# Patient Record
Sex: Male | Born: 1955 | Race: White | Hispanic: No | Marital: Single | Smoking: Current every day smoker
Health system: Southern US, Community
[De-identification: ages and names within clinical notes are randomized; demographics above are authoritative.]

## PROBLEM LIST (undated history)

## (undated) DIAGNOSIS — I10 Essential (primary) hypertension: Secondary | ICD-10-CM

## (undated) DIAGNOSIS — F329 Major depressive disorder, single episode, unspecified: Secondary | ICD-10-CM

## (undated) DIAGNOSIS — F32A Depression, unspecified: Secondary | ICD-10-CM

---

## 2017-10-03 ENCOUNTER — Encounter (HOSPITAL_COMMUNITY): Payer: Self-pay | Admitting: Emergency Medicine

## 2017-10-03 ENCOUNTER — Emergency Department (HOSPITAL_COMMUNITY): Payer: BLUE CROSS/BLUE SHIELD

## 2017-10-03 ENCOUNTER — Emergency Department (HOSPITAL_COMMUNITY)
Admission: EM | Admit: 2017-10-03 | Discharge: 2017-10-03 | Disposition: A | Discharge status: Other Acute Inpatient Hospital | Payer: BLUE CROSS/BLUE SHIELD | Attending: Emergency Medicine | Admitting: Emergency Medicine

## 2017-10-03 ENCOUNTER — Other Ambulatory Visit: Payer: Self-pay

## 2017-10-03 ENCOUNTER — Inpatient Hospital Stay (HOSPITAL_COMMUNITY)
Admission: AD | Admit: 2017-10-03 | Discharge: 2017-10-12 | DRG: 885 | Disposition: A | Discharge status: Home / Self Care | Payer: BLUE CROSS/BLUE SHIELD | Source: Intra-hospital | Attending: Psychiatry | Admitting: Psychiatry

## 2017-10-03 ENCOUNTER — Encounter (HOSPITAL_COMMUNITY): Payer: Self-pay | Admitting: *Deleted

## 2017-10-03 DIAGNOSIS — Z915 Personal history of self-harm: Secondary | ICD-10-CM | POA: Diagnosis not present

## 2017-10-03 DIAGNOSIS — Z560 Unemployment, unspecified: Secondary | ICD-10-CM | POA: Diagnosis not present

## 2017-10-03 DIAGNOSIS — J189 Pneumonia, unspecified organism: Secondary | ICD-10-CM | POA: Diagnosis present

## 2017-10-03 DIAGNOSIS — F17210 Nicotine dependence, cigarettes, uncomplicated: Secondary | ICD-10-CM | POA: Insufficient documentation

## 2017-10-03 DIAGNOSIS — Z598 Other problems related to housing and economic circumstances: Secondary | ICD-10-CM | POA: Diagnosis not present

## 2017-10-03 DIAGNOSIS — Z716 Tobacco abuse counseling: Secondary | ICD-10-CM

## 2017-10-03 DIAGNOSIS — Z008 Encounter for other general examination: Secondary | ICD-10-CM | POA: Diagnosis not present

## 2017-10-03 DIAGNOSIS — F1020 Alcohol dependence, uncomplicated: Secondary | ICD-10-CM | POA: Diagnosis not present

## 2017-10-03 DIAGNOSIS — T506X5A Adverse effect of antidotes and chelating agents, initial encounter: Secondary | ICD-10-CM | POA: Diagnosis not present

## 2017-10-03 DIAGNOSIS — F1024 Alcohol dependence with alcohol-induced mood disorder: Secondary | ICD-10-CM | POA: Diagnosis present

## 2017-10-03 DIAGNOSIS — R45851 Suicidal ideations: Secondary | ICD-10-CM | POA: Diagnosis present

## 2017-10-03 DIAGNOSIS — F332 Major depressive disorder, recurrent severe without psychotic features: Secondary | ICD-10-CM | POA: Insufficient documentation

## 2017-10-03 DIAGNOSIS — N2889 Other specified disorders of kidney and ureter: Secondary | ICD-10-CM | POA: Diagnosis not present

## 2017-10-03 DIAGNOSIS — X838XXA Intentional self-harm by other specified means, initial encounter: Secondary | ICD-10-CM | POA: Insufficient documentation

## 2017-10-03 DIAGNOSIS — M542 Cervicalgia: Secondary | ICD-10-CM | POA: Insufficient documentation

## 2017-10-03 DIAGNOSIS — I10 Essential (primary) hypertension: Secondary | ICD-10-CM | POA: Diagnosis present

## 2017-10-03 DIAGNOSIS — G4700 Insomnia, unspecified: Secondary | ICD-10-CM | POA: Diagnosis present

## 2017-10-03 DIAGNOSIS — F10239 Alcohol dependence with withdrawal, unspecified: Secondary | ICD-10-CM | POA: Diagnosis present

## 2017-10-03 DIAGNOSIS — T1491XA Suicide attempt, initial encounter: Secondary | ICD-10-CM | POA: Diagnosis not present

## 2017-10-03 DIAGNOSIS — Y92230 Patient room in hospital as the place of occurrence of the external cause: Secondary | ICD-10-CM | POA: Diagnosis not present

## 2017-10-03 DIAGNOSIS — J181 Lobar pneumonia, unspecified organism: Secondary | ICD-10-CM | POA: Insufficient documentation

## 2017-10-03 DIAGNOSIS — R110 Nausea: Secondary | ICD-10-CM | POA: Diagnosis not present

## 2017-10-03 DIAGNOSIS — F1010 Alcohol abuse, uncomplicated: Secondary | ICD-10-CM | POA: Diagnosis not present

## 2017-10-03 DIAGNOSIS — F329 Major depressive disorder, single episode, unspecified: Secondary | ICD-10-CM | POA: Diagnosis present

## 2017-10-03 DIAGNOSIS — F419 Anxiety disorder, unspecified: Secondary | ICD-10-CM | POA: Diagnosis present

## 2017-10-03 DIAGNOSIS — Z590 Homelessness: Secondary | ICD-10-CM | POA: Diagnosis not present

## 2017-10-03 DIAGNOSIS — K769 Liver disease, unspecified: Secondary | ICD-10-CM | POA: Diagnosis not present

## 2017-10-03 DIAGNOSIS — R450 Nervousness: Secondary | ICD-10-CM | POA: Diagnosis not present

## 2017-10-03 DIAGNOSIS — F1099 Alcohol use, unspecified with unspecified alcohol-induced disorder: Secondary | ICD-10-CM | POA: Diagnosis not present

## 2017-10-03 DIAGNOSIS — Z638 Other specified problems related to primary support group: Secondary | ICD-10-CM | POA: Diagnosis not present

## 2017-10-03 HISTORY — DX: Major depressive disorder, single episode, unspecified: F32.9

## 2017-10-03 HISTORY — DX: Essential (primary) hypertension: I10

## 2017-10-03 HISTORY — DX: Depression, unspecified: F32.A

## 2017-10-03 LAB — CBC WITH DIFFERENTIAL/PLATELET
ABS IMMATURE GRANULOCYTES: 0 10*3/uL (ref 0.0–0.1)
Basophils Absolute: 0.1 10*3/uL (ref 0.0–0.1)
Basophils Relative: 1 %
Eosinophils Absolute: 0.3 10*3/uL (ref 0.0–0.7)
Eosinophils Relative: 2 %
HEMATOCRIT: 51.9 % (ref 39.0–52.0)
Hemoglobin: 17.5 g/dL — ABNORMAL HIGH (ref 13.0–17.0)
IMMATURE GRANULOCYTES: 0 %
LYMPHS ABS: 2.8 10*3/uL (ref 0.7–4.0)
Lymphocytes Relative: 27 %
MCH: 29.5 pg (ref 26.0–34.0)
MCHC: 33.7 g/dL (ref 30.0–36.0)
MCV: 87.5 fL (ref 78.0–100.0)
MONO ABS: 0.8 10*3/uL (ref 0.1–1.0)
MONOS PCT: 8 %
NEUTROS ABS: 6.5 10*3/uL (ref 1.7–7.7)
NEUTROS PCT: 62 %
Platelets: 352 10*3/uL (ref 150–400)
RBC: 5.93 MIL/uL — ABNORMAL HIGH (ref 4.22–5.81)
RDW: 14.1 % (ref 11.5–15.5)
WBC: 10.5 10*3/uL (ref 4.0–10.5)

## 2017-10-03 LAB — COMPREHENSIVE METABOLIC PANEL
ALBUMIN: 4.4 g/dL (ref 3.5–5.0)
ALK PHOS: 43 U/L (ref 38–126)
ALT: 19 U/L (ref 17–63)
ANION GAP: 11 (ref 5–15)
AST: 20 U/L (ref 15–41)
BILIRUBIN TOTAL: 0.6 mg/dL (ref 0.3–1.2)
BUN: 13 mg/dL (ref 6–20)
CALCIUM: 9.1 mg/dL (ref 8.9–10.3)
CO2: 21 mmol/L — AB (ref 22–32)
Chloride: 103 mmol/L (ref 101–111)
Creatinine, Ser: 0.95 mg/dL (ref 0.61–1.24)
GFR calc Af Amer: >60 mL/min (ref >60)
GFR calc non Af Amer: >60 mL/min (ref >60)
GLUCOSE: 102 mg/dL — AB (ref 65–99)
Potassium: 4.4 mmol/L (ref 3.5–5.1)
SODIUM: 135 mmol/L (ref 135–145)
TOTAL PROTEIN: 7.5 g/dL (ref 6.5–8.1)

## 2017-10-03 LAB — ETHANOL: Alcohol, Ethyl (B): <10 mg/dL (ref <10)

## 2017-10-03 LAB — SALICYLATE LEVEL: Salicylate Lvl: <7 mg/dL (ref 2.8–30.0)

## 2017-10-03 LAB — ACETAMINOPHEN LEVEL: Acetaminophen (Tylenol), Serum: <10 ug/mL — ABNORMAL LOW (ref 10–30)

## 2017-10-03 MED ORDER — ONDANSETRON 4 MG PO TBDP: 4.0000 mg | ORAL_TABLET | Freq: Four times a day (QID) | ORAL | Status: AC | PRN

## 2017-10-03 MED ORDER — VITAMIN B-1 100 MG PO TABS
100.0000 mg | ORAL_TABLET | Freq: Every day | ORAL | Status: DC
  Administered 2017-10-04 – 2017-10-12 (×9): 100 mg via ORAL
  Filled 2017-10-03 (×11): qty 1

## 2017-10-03 MED ORDER — MAGNESIUM HYDROXIDE 400 MG/5ML PO SUSP: 30.0000 mL | Freq: Every day | ORAL | Status: DC | PRN

## 2017-10-03 MED ORDER — LORAZEPAM 1 MG PO TABS
0.0000 mg | ORAL_TABLET | Freq: Four times a day (QID) | ORAL | Status: DC
  Filled 2017-10-03: qty 1

## 2017-10-03 MED ORDER — LORAZEPAM 1 MG PO TABS: 1.0000 mg | ORAL_TABLET | Freq: Two times a day (BID) | ORAL | Status: DC

## 2017-10-03 MED ORDER — ONDANSETRON HCL 4 MG PO TABS: 4.0000 mg | ORAL_TABLET | Freq: Three times a day (TID) | ORAL | Status: DC | PRN

## 2017-10-03 MED ORDER — LORAZEPAM 1 MG PO TABS: 0.0000 mg | ORAL_TABLET | Freq: Two times a day (BID) | ORAL | Status: DC

## 2017-10-03 MED ORDER — HYDROCHLOROTHIAZIDE 25 MG PO TABS
25.0000 mg | ORAL_TABLET | Freq: Every day | ORAL | Status: DC
  Administered 2017-10-03: 25 mg via ORAL
  Filled 2017-10-03: qty 1

## 2017-10-03 MED ORDER — TRAZODONE HCL 50 MG PO TABS
50.0000 mg | ORAL_TABLET | Freq: Every evening | ORAL | Status: DC | PRN
  Administered 2017-10-04 – 2017-10-05 (×2): 50 mg via ORAL
  Filled 2017-10-03 (×6): qty 1

## 2017-10-03 MED ORDER — LORAZEPAM 2 MG/ML IJ SOLN: 0.0000 mg | Freq: Two times a day (BID) | INTRAMUSCULAR | Status: DC

## 2017-10-03 MED ORDER — LORAZEPAM 1 MG PO TABS
1.0000 mg | ORAL_TABLET | Freq: Four times a day (QID) | ORAL | Status: AC
  Administered 2017-10-03 – 2017-10-04 (×4): 1 mg via ORAL
  Filled 2017-10-03 (×3): qty 1

## 2017-10-03 MED ORDER — NICOTINE 21 MG/24HR TD PT24
21.0000 mg | MEDICATED_PATCH | Freq: Once | TRANSDERMAL | Status: DC
  Administered 2017-10-03: 21 mg via TRANSDERMAL
  Filled 2017-10-03: qty 1

## 2017-10-03 MED ORDER — IOHEXOL 300 MG/ML  SOLN
75.0000 mL | Freq: Once | INTRAMUSCULAR | Status: AC
  Administered 2017-10-03: 75 mL via INTRAVENOUS

## 2017-10-03 MED ORDER — HYDROCHLOROTHIAZIDE 25 MG PO TABS
25.0000 mg | ORAL_TABLET | Freq: Every day | ORAL | Status: DC
  Administered 2017-10-04 – 2017-10-12 (×9): 25 mg via ORAL
  Filled 2017-10-03 (×12): qty 1

## 2017-10-03 MED ORDER — LORAZEPAM 2 MG/ML IJ SOLN: 0.0000 mg | Freq: Four times a day (QID) | INTRAMUSCULAR | Status: DC

## 2017-10-03 MED ORDER — ACETAMINOPHEN 325 MG PO TABS
650.0000 mg | ORAL_TABLET | ORAL | Status: DC | PRN
  Administered 2017-10-03: 650 mg via ORAL
  Filled 2017-10-03: qty 2

## 2017-10-03 MED ORDER — LORAZEPAM 1 MG PO TABS
1.0000 mg | ORAL_TABLET | Freq: Three times a day (TID) | ORAL | Status: DC
  Filled 2017-10-03: qty 1

## 2017-10-03 MED ORDER — ZOLPIDEM TARTRATE 5 MG PO TABS
5.0000 mg | ORAL_TABLET | Freq: Every evening | ORAL | Status: DC | PRN
  Filled 2017-10-03: qty 1

## 2017-10-03 MED ORDER — LOPERAMIDE HCL 2 MG PO CAPS: 2.0000 mg | ORAL_CAPSULE | ORAL | Status: AC | PRN

## 2017-10-03 MED ORDER — THIAMINE HCL 100 MG/ML IJ SOLN: 100.0000 mg | Freq: Every day | INTRAMUSCULAR | Status: DC

## 2017-10-03 MED ORDER — VITAMIN B-1 100 MG PO TABS
100.0000 mg | ORAL_TABLET | Freq: Every day | ORAL | Status: DC
  Administered 2017-10-03: 100 mg via ORAL
  Filled 2017-10-03: qty 1

## 2017-10-03 MED ORDER — LORAZEPAM 1 MG PO TABS: 1.0000 mg | ORAL_TABLET | Freq: Four times a day (QID) | ORAL | Status: DC | PRN

## 2017-10-03 MED ORDER — AZITHROMYCIN 250 MG PO TABS
500.0000 mg | ORAL_TABLET | Freq: Once | ORAL | Status: AC
  Administered 2017-10-03: 500 mg via ORAL
  Filled 2017-10-03: qty 2

## 2017-10-03 MED ORDER — ALUM & MAG HYDROXIDE-SIMETH 200-200-20 MG/5ML PO SUSP: 30.0000 mL | Freq: Four times a day (QID) | ORAL | Status: DC | PRN

## 2017-10-03 MED ORDER — HYDROXYZINE HCL 25 MG PO TABS: 25.0000 mg | ORAL_TABLET | Freq: Four times a day (QID) | ORAL | Status: AC | PRN

## 2017-10-03 MED ORDER — ACETAMINOPHEN 325 MG PO TABS
650.0000 mg | ORAL_TABLET | Freq: Four times a day (QID) | ORAL | Status: DC | PRN
  Administered 2017-10-08 – 2017-10-10 (×2): 650 mg via ORAL
  Filled 2017-10-03 (×2): qty 2

## 2017-10-03 MED ORDER — AMOXICILLIN-POT CLAVULANATE ER 1000-62.5 MG PO TB12
2.0000 | ORAL_TABLET | Freq: Two times a day (BID) | ORAL | Status: AC
  Administered 2017-10-04 – 2017-10-10 (×14): 2 via ORAL
  Filled 2017-10-03 (×15): qty 2

## 2017-10-03 MED ORDER — AMOXICILLIN-POT CLAVULANATE ER 1000-62.5 MG PO TB12
2.0000 | ORAL_TABLET | Freq: Two times a day (BID) | ORAL | Status: DC
  Administered 2017-10-03: 2 via ORAL
  Filled 2017-10-03 (×2): qty 2

## 2017-10-03 MED ORDER — NICOTINE 21 MG/24HR TD PT24
21.0000 mg | MEDICATED_PATCH | Freq: Every day | TRANSDERMAL | Status: DC
  Administered 2017-10-04 – 2017-10-12 (×9): 21 mg via TRANSDERMAL
  Filled 2017-10-03 (×12): qty 1

## 2017-10-03 MED ORDER — LORAZEPAM 1 MG PO TABS: 1.0000 mg | ORAL_TABLET | Freq: Every day | ORAL | Status: DC

## 2017-10-03 MED ORDER — ALUM & MAG HYDROXIDE-SIMETH 200-200-20 MG/5ML PO SUSP: 30.0000 mL | ORAL | Status: DC | PRN

## 2017-10-03 MED ORDER — AZITHROMYCIN 250 MG PO TABS
250.0000 mg | ORAL_TABLET | Freq: Every day | ORAL | Status: AC
  Administered 2017-10-04 – 2017-10-07 (×4): 250 mg via ORAL
  Filled 2017-10-03 (×4): qty 1

## 2017-10-03 MED ORDER — AZITHROMYCIN 250 MG PO TABS: 250.0000 mg | ORAL_TABLET | Freq: Every day | ORAL | Status: DC

## 2017-10-03 NOTE — ED Triage Notes (Addendum)
Pt states he has been having suicidal thoughts for several weeks. Pt endorses suicidal attempt of hanging himself this morning. Pt endorses voises telling himself to kill himself. Denies homicidal ideation. Pt has been hospitalized for depression 4 years ago in [place]. Pt has hx of suicidal attempts in the past. Pt endorses frequent drinking, yesterday had 1 pint of vodka and several beers. Pt was sober for 2 years but started drinking again in September. No hx of DTs/ seizures.

## 2017-10-03 NOTE — Tx Team (Signed)
Initial Treatment Plan 10/03/2017 11:41 PM David Small [id number]    PATIENT STRESSORS: Financial difficulties Medication change or noncompliance Substance abuse   PATIENT STRENGTHS: Ability for insight Average or above average intelligence Capable of independent living Communication skills General fund of knowledge Motivation for treatment/growth   PATIENT IDENTIFIED PROBLEMS: Depression Alcohol abuse Suicidal thoughts and actions "I don't know, I had to do something, this is the second time this week I tried to kill myself"                     DISCHARGE CRITERIA:  Ability to meet basic life and health needs Improved stabilization in mood, thinking, and/or behavior Reduction of life-threatening or endangering symptoms to within safe limits Verbal commitment to aftercare and medication compliance  PRELIMINARY DISCHARGE PLAN: Attend aftercare/continuing care group Return to previous living arrangement  PATIENT/FAMILY INVOLVEMENT: This treatment plan has been presented to and reviewed with the patient, David Small, and/or family member, .  The patient and family have been given the opportunity to ask questions and make suggestions.  Journie Howson, [place], [place] 10/03/2017, 11:41 PM

## 2017-10-03 NOTE — Progress Notes (Signed)
David Small is a 62 year old male pt admitted on voluntary basis. On admission, David Small endorses depression and passive SI currently but is able to contract for safety while in the hospital. He spoke about how he tried to hang himself earlier in the day and reports it being the second time this week where he has tried something. He reports loneliness, financial issues and substance abuse as issues that he is dealing with. He reports that he was hospitalized about 5 years ago in [place] and reports he was prescribed medication but stopped after awhile and did not go for any follow-up. He reports that he has gone to a clinic in [place] recently that prescribed him medications for blood pressure but reports that he did not go get them filled. He reports that he does not currently have a primary care doctor. He reports that he has been abusing alcohol but denies any other substance abuse issue. He does not display any overt signs or symptoms of withdrawal however BP was 186/99 and was trending high in the emergency department. David Small reports that he rents a house with some friends and reports that he can go back there but is unsure if he wants to go back there. David Small was cooperative on admission and was oriented to the unit and safety was maintained.

## 2017-10-03 NOTE — ED Notes (Signed)
Reg. Diet ordered for pt.

## 2017-10-03 NOTE — ED Provider Notes (Signed)
[facility] EMERGENCY DEPARTMENT Provider Note   CSN: [id number] Arrival date & time: 10/03/17  1257     History   Chief Complaint Chief Complaint  Patient presents with  . Suicide Attempt  . Alcohol Problem    HPI David Small is a 62 y.o. male.  HPI  62 year old male with a history of depression and hypertension presents with suicidal thoughts and 2 suicide attempts. 2 days ago he tried to hang himself with a rope.  Tried again today.  He has some mild posterior neck pain where the knot was tied.  However he denies any trouble swallowing or speaking.  He has been feeling progressively depressed for about 3 weeks.  He also drinks a lot of alcohol daily, a minimum of about 7 beers per day.  He states that he has no significant family around and has been progressively more depressed.  Many years ago he used to be on medicine for depression but does not know what it was.  He has a chronic cough but denies any new or changing cough or fever/shortness of breath.  Past Medical History:  Diagnosis Date  . Depression   . Hypertension     Patient Active Problem List   Diagnosis Date Noted  . Severe recurrent major depression without psychotic features (HCC) 10/03/2017    History reviewed. No pertinent surgical history.      Home Medications    Prior to Admission medications   Not on File    Family History No family history on file.  Social History Social History   Tobacco Use  . Smoking status: Current Every Day Smoker    Types: Cigarettes  . Smokeless tobacco: Never Used  Substance Use Topics  . Alcohol use: Yes  . Drug use: Not Currently     Allergies   Patient has no known allergies.   Review of Systems Review of Systems  Constitutional: Negative for fever.  HENT: Negative for trouble swallowing and voice change.   Respiratory: Positive for cough. Negative for shortness of breath.   Cardiovascular: Negative for chest pain.    Musculoskeletal: Positive for neck pain.  Psychiatric/Behavioral: Positive for self-injury and suicidal ideas.  All other systems reviewed and are negative.    Physical Exam Updated Vital Signs BP (!) 168/95 (BP Location: Left Arm)   Pulse 81   Temp 98.4 F (36.9 C) (Oral)   Resp (!) 21   Ht 6' (1.829 m)   Wt 99.8 kg (220 lb)   SpO2 95%   BMI 29.84 kg/m   Physical Exam  Constitutional: He is oriented to person, place, and time. He appears well-developed and well-nourished. No distress.  HENT:  Head: Normocephalic and atraumatic.  Right Ear: External ear normal.  Left Ear: External ear normal.  Nose: Nose normal.  Eyes: Right eye exhibits no discharge. Left eye exhibits no discharge.  Neck: Phonation normal. Neck supple. No tracheal tenderness, no spinous process tenderness and no muscular tenderness present. No neck rigidity. No tracheal deviation, no erythema and normal range of motion present.  Cardiovascular: Normal rate, regular rhythm and normal heart sounds.  Pulmonary/Chest: Effort normal. No accessory muscle usage. No tachypnea. He has decreased breath sounds in the right lower field.  Abdominal: Soft. There is no tenderness.  Musculoskeletal: He exhibits no edema.  Neurological: He is alert and oriented to person, place, and time.  Skin: Skin is warm and dry. He is not diaphoretic.  Nursing note and vitals reviewed.  ED Treatments / Results  Labs (all labs ordered are listed, but only abnormal results are displayed) Labs Reviewed  COMPREHENSIVE METABOLIC PANEL - Abnormal; Notable for the following components:      Result Value   CO2 21 (*)    Glucose, Bld 102 (*)    All other components within normal limits  CBC WITH DIFFERENTIAL/PLATELET - Abnormal; Notable for the following components:   RBC 5.93 (*)    Hemoglobin 17.5 (*)    All other components within normal limits  ACETAMINOPHEN LEVEL - Abnormal; Notable for the following components:    Acetaminophen (Tylenol), Serum <10 (*)    All other components within normal limits  ETHANOL  SALICYLATE LEVEL  RAPID URINE DRUG SCREEN, HOSP PERFORMED    EKG None  Radiology Dg Chest 2 View  Result Date: 10/03/2017 CLINICAL DATA:  Medical clearance.  Suicide attempt.  Hypertension. EXAM: CHEST - 2 VIEW COMPARISON:  None. FINDINGS: There is patchy opacity in right mid lung anteriorly, concerning for a small focus of pneumonia. There is probable scarring in the lateral right base. Lungs elsewhere are clear. Heart size and pulmonary vascularity normal. No adenopathy. No bone lesions. IMPRESSION: Focal airspace opacity in the inferior aspect of the anterior segment right upper lobe, concerning for pneumonia. It is possible that this area represents scarring; there are no prior studies to compare. There is apparent scarring in the lateral right base with blunting the right costophrenic angle. Lungs elsewhere clear. Heart size normal. No evident adenopathy. Followup PA and lateral chest radiographs recommended in 3-4 weeks following trial of antibiotic therapy to ensure resolution and exclude underlying malignancy. Electronically Signed   By: Bretta BangWilliam  Woodruff III M.D.   On: 10/03/2017 13:58   Ct Soft Tissue Neck W Contrast  Result Date: 10/03/2017 CLINICAL DATA:  Attempted hanging.  Diffuse neck pain. EXAM: CT NECK WITH CONTRAST TECHNIQUE: Multidetector CT imaging of the neck was performed using the standard protocol following the bolus administration of intravenous contrast. CONTRAST:  75mL OMNIPAQUE IOHEXOL 300 MG/ML  SOLN COMPARISON:  None. FINDINGS: Pharynx and larynx: No mucosal or submucosal lesion. Oropharynx, hypopharynx and larynx superior intact. The glottis appears voluntarily closed. Salivary glands: Submandibular and parotid glands are normal. Thyroid: Normal Lymph nodes: No enlarged or low-density nodes on either side of the neck. Vascular: No abnormal vascular finding other than minimal  atherosclerosis at the carotid bifurcations. Limited intracranial: Normal Visualized orbits: Normal Mastoids and visualized paranasal sinuses: Normal Skeleton: No fracture. Ordinary osteoarthritis at the C1-2 articulation. Degenerative spondylosis most pronounced at C3-4 and C6-7. Upper chest: Negative Other: No evidence of disruption of the laryngeal cartilages. IMPRESSION: No traumatic finding by CT. No evidence of disruption of the airway. The glottis appears to be voluntarily closed. No evidence of cartilage injury or bone injury. No evidence of soft tissue hematoma. Electronically Signed   By: Paulina FusiMark  Shogry M.D.   On: 10/03/2017 17:18    Procedures Procedures (including critical care time)  Medications Ordered in ED Medications  iohexol (OMNIPAQUE) 300 MG/ML solution 75 mL (75 mLs Intravenous Contrast Given 10/03/17 1700)  azithromycin (ZITHROMAX) tablet 500 mg (500 mg Oral Given 10/03/17 1947)     Initial Impression / Assessment and Plan / ED Course  I have reviewed the triage vital signs and the nursing notes.  Pertinent labs & imaging results that were available during my care of the patient were reviewed by me and considered in my medical decision making (see chart for details).  Patient does not appear to have any significant trauma from his attempted suicide.  He is noted to be hypertensive but asymptomatic.  He will be started on anti-hypertensive medicines.  He does have some cough and with x-ray findings concerning for possible pneumonia, he will be covered for bacterial pneumonia with Augmentin and azithromycin.  However I do think he needs emergent psychiatric treatment and otherwise appears medically stable for this.  He will be admitted to the behavioral health  Final Clinical Impressions(s) / ED Diagnoses   Final diagnoses:  Community acquired pneumonia of right upper lobe of lung (HCC)  Alcohol abuse  Suicide attempt [facility])    ED Discharge Orders    None         Pricilla Loveless, MD 10/04/17 0005

## 2017-10-03 NOTE — ED Notes (Signed)
Patient transported to CT. Sitter with pt aware that pt is going to be transferred to [facility] upon return from ED.

## 2017-10-03 NOTE — ED Notes (Signed)
Belongings in locker 5 for primary RN to inventory

## 2017-10-03 NOTE — ED Notes (Signed)
TTS bedside 

## 2017-10-03 NOTE — ED Provider Notes (Signed)
Patient placed in Quick Look pathway, seen and evaluated   Chief Complaint: Suicide attempt  HPI:   62 year old male w/ a h/o of HTN and depression presenting with suicide attempt.  The patient attempted to hang himself.  He states that he placed the rope around his neck and actually pulled the chair out from under his feet.  He is now endorsing diffuse pain to the anterolateral neck.  He denies dyspnea, chest pain, or dysphagia.  He reports constant, worsening suicidal ideation over the last 3 weeks.  He has a history of previous suicide attempts by hanging.  He was previously taking antidepressants, but it is not taking any home medications at this time.  He was previously admitted for inpatient behavioral health treatment at a facility and [place] over the last few years.  He denies HI or auditory visual hallucinations.  He also reports worsening alcohol dependence that has been worsening since September 2018.  He is drinking approximately 1/5 of vodka and several beers every day.  He previously was able to go 2 years without drinking any alcohol.  Alcohol consumption has been progressively worsening.  No history of DTs or seizures or ICU admission from alcohol withdrawal.  Last drink was last night at 9 PM.  He is a current everyday smoker.  Denies IV or recreational drug use.   ROS: SI  Physical Exam:   Gen: No distress  Neuro: Awake and Alert  Skin: Warm    Focused Exam: No tenderness to palpation to the cervical spinous processes.  No tenderness to the bilateral paracervical spinal muscles.  He is diffusely tender to the musculature of the anterolateral neck.  Carotid pulses are 2+ and symmetric.  There is some erythema noted to the proximal skin of the anterior chest.  He is unable to state if this is new.  No ligature marks.   Initiation of care has begun. The patient has been counseled on the process, plan, and necessity for staying for the completion/evaluation, and the remainder of  the medical screening examination    Barkley BoardsMcDonald, Taylie Helder A, PA-C 10/03/17 1423    Gerhard MunchLockwood, [place], MD 10/05/17 2341

## 2017-10-03 NOTE — Progress Notes (Signed)
Report received from [place], [place]. Pt was oriented to unit. Bedtime medication given. Pt states he wants to go to sleep. No additional concerns. Will continue with POC.

## 2017-10-03 NOTE — BH Assessment (Signed)
[facility] Assessment Progress Note  Called pt's nurse to see if he can be put in a room for evaluation and she is in a trauma at this time. [facility] will call back when pt is in a room per Investment banker, operationalN writer spoke with.

## 2017-10-03 NOTE — BH Assessment (Signed)
Tele Assessment Note   Patient Name: David Small MRN: [id number] Referring Physician: Lawrence MarseillesGoldstone Location of Patient: [facility] Location of Provider: Behavioral Health TTS Department  David Small is an 62 y.o. male who presents voluntarily accompanied reporting primary symptoms of depression. He states that he attempted to hand himself  This morning. Pt endorses social withdrawal, loss of interest in usual pleasures, decreased concentration, fatigue, irritability, decreased sleep, decreased appetite and feelings of hopelessness.  Pt endorses SI, denies HI, psychosis. Pt  States that he relapsed drinking last September after being sober for 2 1/2 years. PT describes a couple of past attempts, no history of violence. Pt states that onset of symptoms began getting worse in the past month.  Pt identifies primary stressors as some things that have been going on in his life that he does not want to elaborate on, "It is too long of a story". He feels alone and has no support, feels hopeless. Pt identifies primary residence as in an apartment with some roommates, but has been feeling so low lately that he went to a hotel to be alone.   Pt denies legal involvement. Pt denies abuse history.  Pt identifiesprevious treatment as IP treatment 4 years ago followed up by Op which he quit after about 1 year when he felt like it wasn't helping. Pt cannot remember the medication he was on. Pt has fair insight and judgment. Pt's memory is typical.? ? MSE: Pt is casually dressed, alert, oriented x4 with normal speech and normal motor behavior. Eye contact is good. Pt's mood is depressed and affect is depressed and anxious. Affect is congruent with mood. Thought process is coherent and relevant. There is no indication that pt is currently responding to internal stimuli or experiencing delusional thought content. Pt was cooperative throughout assessment.   Nira ConnJason Berry, NP recommended intpatient psychiatric  treatment. Per Delorise Jacksonori, [facility], [facility] when his blood pressure comes down. Notified EDP/staff.  Diagnosis: MDD, recurrent, severe, without psychotic features  Past Medical History:  Past Medical History:  Diagnosis Date  . Depression   . Hypertension     History reviewed. No pertinent surgical history.  Family History: No family history on file.  Social History:  has no tobacco, alcohol, and drug history on file.  Additional Social History:  Alcohol / Drug Use Pain Medications: denies Prescriptions: denies Over the Counter: denies History of alcohol / drug use?: Yes Longest period of sobriety (when/how long): 10-20 years Substance #1 Name of Substance 1: alcohol 1 - Age of First Use: unknown 1 - Amount (size/oz): variable 1 - Frequency: daily 1 - Duration: since September 1 - Last Use / Amount: last night  CIWA: CIWA-Ar BP: (!) 170/94 Pulse Rate: 83 Nausea and Vomiting: no nausea and no vomiting Tactile Disturbances: none Tremor: three Auditory Disturbances: not present Paroxysmal Sweats: no sweat visible Visual Disturbances: not present Anxiety: no anxiety, at ease Headache, Fullness in Head: none present Agitation: normal activity Orientation and Clouding of Sensorium: oriented and can do serial additions CIWA-Ar Total: 3 COWS:    Allergies: No Known Allergies  Home Medications:  (Not in a hospital admission)  OB/GYN Status:  No LMP for male patient.  General Assessment Data [facility] Assessment: In system Is this a Tele or Face-to-Face Assessment?: Tele Assessment Is this an Initial Assessment or a Re-assessment for this encounter?: Initial Assessment Marital status: Single Is patient pregnant?: No Pregnancy Status: No Living Arrangements: (roomates) Can  pt return to current living arrangement?: Yes Admission Status: Voluntary Is patient capable of signing voluntary admission?: Yes Referral Source:  Self/Family/Friend Insurance type: BCBS     Crisis Care Plan Living Arrangements: (roomates) Name of Psychiatrist: none Name of Therapist: none  Education Status Is patient currently in school?: No Is the patient employed, unemployed or receiving disability?: Employed  Risk to self with the past 6 months Suicidal Ideation: Yes-Currently Present Has patient been a risk to self within the past 6 months prior to admission? : Yes Suicidal Intent: Yes-Currently Present Has patient had any suicidal intent within the past 6 months prior to admission? : Yes Is patient at risk for suicide?: Yes Suicidal Plan?: Yes-Currently Present Has patient had any suicidal plan within the past 6 months prior to admission? : Yes Specify Current Suicidal Plan: attempted hanging this am Access to Means: Yes Specify Access to Suicidal Means: rope What has been your use of drugs/alcohol within the last 12 months?: see SA section Previous Attempts/Gestures: Yes How many times?: (unkown) Triggers for Past Attempts: Unpredictable Intentional Self Injurious Behavior: None Family Suicide History: No Recent stressful life event(s): Turmoil (Comment)(pt declined to elaborate) Persecutory voices/beliefs?: No Depression: Yes Depression Symptoms: Despondent, Tearfulness, Isolating, Fatigue, Guilt, Loss of interest in usual pleasures, Feeling worthless/self pity, Feeling angry/irritable Substance abuse history and/or treatment for substance abuse?: Yes Suicide prevention information given to non-admitted patients: Not applicable  Risk to Others within the past 6 months Homicidal Ideation: No Does patient have any lifetime risk of violence toward others beyond the six months prior to admission? : No Thoughts of Harm to Others: No Current Homicidal Intent: No Current Homicidal Plan: No Access to Homicidal Means: No History of harm to others?: No Assessment of Violence: None Noted Does patient have access to  weapons?: No Criminal Charges Pending?: No Does patient have a court date: No Is patient on probation?: No  Psychosis Hallucinations: None noted Delusions: None noted  Mental Status Report Appearance/Hygiene: Unremarkable, In scrubs Eye Contact: Good Motor Activity: Unremarkable Speech: Logical/coherent Level of Consciousness: Alert Mood: Depressed, Anxious Affect: Anxious, Depressed Anxiety Level: Moderate Thought Processes: Coherent, Relevant Judgement: Partial Orientation: Person, Place, Time, Situation, Appropriate for developmental age Obsessive Compulsive Thoughts/Behaviors: Minimal  Cognitive Functioning Concentration: Normal Memory: Recent Intact, Remote Intact Is patient IDD: No Is patient DD?: No Insight: Fair Impulse Control: Fair Appetite: Poor Have you had any weight changes? : No Change Sleep: No Change Total Hours of Sleep: 8 Vegetative Symptoms: None  ADLScreening [facility] Assessment Services) Patient's cognitive ability adequate to safely complete daily activities?: Yes Patient able to express need for assistance with ADLs?: Yes Independently performs ADLs?: Yes (appropriate for developmental age)  Prior Inpatient Therapy Prior Inpatient Therapy: Yes Prior Therapy Dates: 4 years sgo Prior Therapy Facilty/Provider(s): in [place] Reason for Treatment: depression  Prior Outpatient Therapy Prior Outpatient Therapy: Yes Prior Therapy Dates: unk Prior Therapy Facilty/Provider(s): unk Reason for Treatment: depression Does patient have an ACCT team?: No Does patient have Intensive In-House Services?  : No Does patient have [facility] services? : No Does patient have P4CC services?: No  ADL Screening (condition at time of admission) Patient's cognitive ability adequate to safely complete daily activities?: Yes Is the patient deaf or have difficulty hearing?: No Does the patient have difficulty seeing, even when wearing glasses/contacts?: No Does the patient  have difficulty concentrating, remembering, or making decisions?: No Patient able to express need for assistance with ADLs?: Yes Does the patient have difficulty dressing or  bathing?: No Independently performs ADLs?: Yes (appropriate for developmental age) Does the patient have difficulty walking or climbing stairs?: No Weakness of Legs: None Weakness of Arms/Hands: None  Home Assistive Devices/Equipment Home Assistive Devices/Equipment: None  Therapy Consults (therapy consults require a physician order) PT Evaluation Needed: No OT Evalulation Needed: No SLP Evaluation Needed: No Abuse/Neglect Assessment (Assessment to be complete while patient is alone) Abuse/Neglect Assessment Can Be Completed: Yes Physical Abuse: Denies Verbal Abuse: Denies Sexual Abuse: Denies Exploitation of patient/patient's resources: Denies Self-Neglect: Denies Values / Beliefs Cultural Requests During Hospitalization: None Spiritual Requests During Hospitalization: None Consults Spiritual Care Consult Needed: No Social Work Consult Needed: No Merchant navy officer (For Healthcare) Does Patient Have a Medical Advance Directive?: No Would patient like information on creating a medical advance directive?: No - Patient declined    Additional Information 1:1 In Past 12 Months?: No CIRT Risk: No Elopement Risk: No Does patient have medical clearance?: Yes     Disposition:  Disposition Initial Assessment Completed for this Encounter: Yes Disposition of Patient: Admit Type of inpatient treatment program: Adult  This service was provided via telemedicine using a 2-way, interactive audio and Immunologist.  Names of all persons participating in this telemedicine service and their role in this encounter.               Yitzel Shasteen Hines 10/03/2017 9:09 PM

## 2017-10-03 NOTE — ED Notes (Addendum)
Per TTS[facility] can can decrease BP by 23:00. Per Criss AlvineGoldston, Md patient was given BP meds @ 19:45 and will take a little time to work. Will reassess BP @ 22:00.

## 2017-10-04 DIAGNOSIS — Z560 Unemployment, unspecified: Secondary | ICD-10-CM

## 2017-10-04 DIAGNOSIS — X838XXA Intentional self-harm by other specified means, initial encounter: Secondary | ICD-10-CM

## 2017-10-04 DIAGNOSIS — T1491XA Suicide attempt, initial encounter: Secondary | ICD-10-CM

## 2017-10-04 DIAGNOSIS — R450 Nervousness: Secondary | ICD-10-CM

## 2017-10-04 DIAGNOSIS — Z598 Other problems related to housing and economic circumstances: Secondary | ICD-10-CM

## 2017-10-04 DIAGNOSIS — I10 Essential (primary) hypertension: Secondary | ICD-10-CM

## 2017-10-04 DIAGNOSIS — F17210 Nicotine dependence, cigarettes, uncomplicated: Secondary | ICD-10-CM

## 2017-10-04 DIAGNOSIS — F1024 Alcohol dependence with alcohol-induced mood disorder: Secondary | ICD-10-CM | POA: Diagnosis present

## 2017-10-04 DIAGNOSIS — F332 Major depressive disorder, recurrent severe without psychotic features: Principal | ICD-10-CM

## 2017-10-04 DIAGNOSIS — J189 Pneumonia, unspecified organism: Secondary | ICD-10-CM

## 2017-10-04 DIAGNOSIS — Z915 Personal history of self-harm: Secondary | ICD-10-CM

## 2017-10-04 DIAGNOSIS — F419 Anxiety disorder, unspecified: Secondary | ICD-10-CM

## 2017-10-04 DIAGNOSIS — F1010 Alcohol abuse, uncomplicated: Secondary | ICD-10-CM

## 2017-10-04 LAB — HEMOGLOBIN A1C
Hgb A1c MFr Bld: 5.7 % — ABNORMAL HIGH (ref 4.8–5.6)
Mean Plasma Glucose: 116.89 mg/dL

## 2017-10-04 LAB — LIPID PANEL
Cholesterol: 221 mg/dL — ABNORMAL HIGH (ref 0–200)
HDL: 57 mg/dL (ref >40)
LDL CALC: 139 mg/dL — AB (ref 0–99)
TRIGLYCERIDES: 125 mg/dL (ref <150)
Total CHOL/HDL Ratio: 3.9 RATIO
VLDL: 25 mg/dL (ref 0–40)

## 2017-10-04 LAB — TSH: TSH: 4.175 u[IU]/mL (ref 0.350–4.500)

## 2017-10-04 MED ORDER — LISINOPRIL 5 MG PO TABS
5.0000 mg | ORAL_TABLET | Freq: Every day | ORAL | Status: DC
  Administered 2017-10-04 – 2017-10-05 (×2): 5 mg via ORAL
  Filled 2017-10-04 (×4): qty 1

## 2017-10-04 MED ORDER — SERTRALINE HCL 25 MG PO TABS
25.0000 mg | ORAL_TABLET | Freq: Every day | ORAL | Status: DC
  Administered 2017-10-04 – 2017-10-05 (×2): 25 mg via ORAL
  Filled 2017-10-04 (×4): qty 1

## 2017-10-04 MED ORDER — LORAZEPAM 1 MG PO TABS
1.0000 mg | ORAL_TABLET | Freq: Once | ORAL | Status: AC
  Administered 2017-10-04: 1 mg via ORAL
  Filled 2017-10-04: qty 1

## 2017-10-04 NOTE — [facility] Counselor (Signed)
Adult Comprehensive Assessment  Patient ID: David Small, male   DOB: Feb 03, 1956, 62 y.o.   MRN: [id number]  Information Source: Information source: Patient  Current Stressors:  Patient states their primary concerns and needs for treatment are:: need something to keep me more even keeled Patient states their goals for this hospitilization and ongoing recovery are:: find an even keel Employment / Job issues: Lost my job recently but I have found something part time.   Family Relationships: "My family has disowned me."  I don't see a reason to keep going.  Social relationships: Friends have left also.    Living/Environment/Situation:  Living Arrangements: Non-relatives/Friends(2 guys, boarding house situation) Living conditions (as described by patient or guardian): Pt gets along with house mates. safe Who else lives in the home?: none What is atmosphere in current home: Comfortable  Family History:  Marital status: Divorced Divorced, when?: 10 years ago What types of issues is patient dealing with in the relationship?: No current relationship Are you sexually active?: No What is your sexual orientation?: heterosexual Has your sexual activity been affected by drugs, alcohol, medication, or emotional stress?: na Does patient have children?: Yes How many children?: 1 How is patient's relationship with their children?: son age 63.  They don't get along, issues going back to pt's divorce when son was 10.  No contact in 8 years.  Childhood History:  By whom was/is the patient raised?: Both parents Additional childhood history information: Parents remained married.  good childhood Description of patient's relationship with caregiver when they were a child: mom: great, dad: he worked a lot, but OK.  They were close later in life prior to dad's death. Patient's description of current relationship with people who raised him/her: both parents deceased. How were you disciplined when you got in  trouble as a child/adolescent?: appropriate discipline Does patient have siblings?: Yes Number of Siblings: 2 Description of patient's current relationship with siblings: one brother, one sister.  No contact with either.  Long term conflict with brother.  Problems with sister during second divorce. Did patient suffer any verbal/emotional/physical/sexual abuse as a child?: No Did patient suffer from severe childhood neglect?: No Has patient ever been sexually abused/assaulted/raped as an adolescent or adult?: No Was the patient ever a victim of a crime or a disaster?: No Witnessed domestic violence?: No Has patient been effected by domestic violence as an adult?: No  Education:  Highest grade of school patient has completed: associates degree in Chief Financial Officermarketing Currently a Consulting civil engineerstudent?: No Learning disability?: No  Employment/Work Situation:   Employment situation: Retired(part time employment: Pharmacist, [employer]) Patient's job has been impacted by current illness: No What is the longest time patient has a held a job?: 18 years Where was the patient employed at that time?: [employer] Did You Receive Any Psychiatric Treatment/Services While in the [employer]?: No(No [employer]ilitary service) Are There Guns or Other Weapons in Your Home?: No  Financial Resources:   Financial resources: Income from employment, Private insurance(social security/retirement) Does patient have a Lawyerrepresentative payee or guardian?: No  Alcohol/Substance Abuse:   What has been your use of drugs/alcohol within the last 12 months?: alcohol: daily use, 4-5 beers daily, (up to 10) past 10 months.  drugs: pt denies If attempted suicide, did drugs/alcohol play a role in this?: Yes Alcohol/Substance Abuse Treatment Hx: Past Tx, Inpatient If yes, describe treatment: [place] [facility] Has alcohol/substance abuse ever caused legal problems?: No  Social Support System:   Conservation officer, natureatient's Community Support System:  None Type of faith/religion: Ephriam Knuckles How does patient's faith help to cope with current illness?: I don't know  Leisure/Recreation:   Leisure and Hobbies: play golf, take walks, bike  Strengths/Needs:   What is the patient's perception of their strengths?: I'm a problem solver, intelligence Patient states they can use these personal strengths during their treatment to contribute to their recovery: possible to start over with new relationships and heal old family relationships Patient states these barriers may affect/interfere with their treatment: none Patient states these barriers may affect their return to the community: hopelessness, transportation, financial Other important information patient would like considered in planning for their treatment: none  Discharge Plan:   Currently receiving community mental health services: No Patient states concerns and preferences for aftercare planning are: Pt not sure where he is going to live. Patient states they will know when they are safe and ready for discharge when: when I feel some hope Does patient have access to transportation?: No Does patient have financial barriers related to discharge medications?: No Patient description of barriers related to discharge medications: CSW assessing for plan Plan for no access to transportation at discharge: CSW assessing for plan Plan for living situation after discharge: CSW assessing for plan. Will patient be returning to same living situation after discharge?: No  Summary/Recommendations:   Summary and Recommendations (to be completed by the evaluator): Pt is 62 year old male, currently in [place], but may be losing his housing.  Pt is diagnosed with major depressive disorder and was admitted due to increased depression and two recent suicide attempts.  Pt is cut off from family and friends and experiencing hopelessness.  Recommendations for pt include crisis stabilization, therapeutic milieu,  attend and participate in groups, medication management, and development of comprehensive mental wellness plan.  Lorri Frederick. 10/04/2017

## 2017-10-04 NOTE — Progress Notes (Signed)
Pt was observed in the dayroom, attending wrap-up group. Pt appears depressed in affect and mood. Pt denies SI/HI/AVH/Pain at this time. Pt states he feels "sedated" today. Pt states he does not want to take Ativan anymore. BP remains elevated; Pt asymptomatic. Will continue with POC.

## 2017-10-04 NOTE — [facility] Group Notes (Signed)
[facility] LCSW Group Therapy Note  Date/Time: 10/04/17, 1315  Type of Therapy/Topic:  Group Therapy:  Balance in Life  Participation Level:  active  Description of Group:    This group will address the concept of balance and how it feels and looks when one is unbalanced. Patients will be encouraged to process areas in their lives that are out of balance, and identify reasons for remaining unbalanced. Facilitators will guide patients utilizing problem- solving interventions to address and correct the stressor making their life unbalanced. Understanding and applying boundaries will be explored and addressed for obtaining  and maintaining a balanced life. Patients will be encouraged to explore ways to assertively make their unbalanced needs known to significant others in their lives, using other group members and facilitator for support and feedback.  Therapeutic Goals: 1. Patient will identify two or more emotions or situations they have that consume much of in their lives. 2. Patient will identify signs/triggers that life has become out of balance:  3. Patient will identify two ways to set boundaries in order to achieve balance in their lives:  4. Patient will demonstrate ability to communicate their needs through discussion and/or role plays  Summary of Patient Progress:Pt shared that friends, family, and financial as areas of his life that are out of balance.  Pt participated in group discussion regarding ways to recognize and return these areas of life to better balance. Pt shared quite a bit about his situation and interacted with other group members as well.          Therapeutic Modalities:   Cognitive Behavioral Therapy Solution-Focused Therapy Assertiveness Training  Daleen SquibbGreg Yanet [place], [place]

## 2017-10-04 NOTE — [facility] Suicide Risk Assessment (Signed)
[facility] INPATIENT:  Family/Significant Other Suicide Prevention Education  Suicide Prevention Education:  Patient Refusal for Family/Significant Other Suicide Prevention Education: The patient David Small has refused to provide written consent for family/significant other to be provided Family/Significant Other Suicide Prevention Education during admission and/or prior to discharge.  Physician notified.  Lorri FrederickWierda, [place] Terrones Jon, LCSW 10/04/2017, 2:59 PM

## 2017-10-04 NOTE — H&P (Signed)
Psychiatric Admission Assessment Adult  Patient Identification: David Small MRN:  [id number] Date of Evaluation:  10/04/2017 Chief Complaint:  MDD alocohol use disorder moderate Principal Diagnosis: Severe recurrent major depression without psychotic features (HCC) Diagnosis:   Patient Active Problem List   Diagnosis Date Noted  . Severe recurrent major depression without psychotic features (HCC) [F33.2] 10/03/2017   History of Present Illness:  10/03/17 [facility] Counselor Assessment: 62 y.o. male who presents voluntarily accompanied reporting primary symptoms of depression. He states that he attempted to hand himself  This morning. Pt endorses social withdrawal, loss of interest in usual pleasures, decreased concentration, fatigue, irritability, decreased sleep, decreased appetite and feelings of hopelessness. Pt endorses SI, denies HI, psychosis. Pt  States that he relapsed drinking last September after being sober for 2 1/2 years. PT describes a couple of past attempts, no history of violence. Pt states that onset of symptoms began getting worse in the past month. Pt identifies primary stressors as some things that have been going on in his life that he does not want to elaborate on, "It is too long of a story". He feels alone and has no support, feels hopeless. Pt identifies primary residence as in an apartment with some roommates, but has been feeling so low lately that he went to a hotel to be alone.  Pt denies legal involvement. Pt denies abuse history. Pt identifiesprevious treatment as IP treatment 4 years ago followed up by Op which he quit after about 1 year when he felt like it wasn't helping. Pt cannot remember the medication he was on. Pt has fair insight and judgment. Pt's memory is typical.?  10/04/17 [facility] MD Assessment: Patient is seen and examined.  Patient is a 63 year old male with a past psychiatric history significant for major depression as well as alcohol use disorder.  He  presented to [facility] emergency department yesterday with suicidal ideation.  The patient presented with symptoms of depression including helplessness, hopelessness, worthlessness, fatigue, suicidal ideation.  The patient admitted that he had attempted to hang himself 1 to 2 days prior to admission.  He had a CT scan of the neck which was essentially negative.  He also complained of a cough, and was found to have a pneumonia by chest x-ray.  He stated that he his depression had worsened over the last 7 months.  He had been sober for approximately 2-1/2 years, but then started drinking approximately 7 months ago.  He stated since then his depression worsened.  He stated that he had no friends, no family contacts, and had recently lost his part-time job.  This put his housing in some significant problem.  He admitted to a previous suicide attempt in 2015.  He was hospitalized at that time.  He is unsure what medication he took at that time, but believes it may have been Zoloft.  He stated that after his hospitalization he was unable to afford that medication.  He was admitted to the hospital for evaluation and stabilization.  On evaluation today: Patient confirms the other information and feels that he has told everything he needs to say at this time. He agree to attend groups and to participate in activities . He states taht he will be safe on the unit, with no current SI/HI/AVH.   Associated Signs/Symptoms: Depression Symptoms:  depressed mood, anhedonia, fatigue, feelings of worthlessness/guilt, hopelessness, suicidal thoughts with specific plan, suicidal attempt, loss of energy/fatigue, disturbed sleep, (Hypo) Manic Symptoms:  Impulsivity, Anxiety Symptoms:  Excessive Worry,  Psychotic Symptoms:  Denies PTSD Symptoms: NA Total Time spent with patient: 30 minutes  Past Psychiatric History: 1 previous hospitalization for suicide attempt  Is the patient at risk to self? Yes.    Has the  patient been a risk to self in the past 6 months? Yes.    Has the patient been a risk to self within the distant past? Yes.    Is the patient a risk to others? No.  Has the patient been a risk to others in the past 6 months? No.  Has the patient been a risk to others within the distant past? No.   Prior Inpatient Therapy:   Prior Outpatient Therapy:    Alcohol Screening: 1. How often do you have a drink containing alcohol?: 4 or more times a week 2. How many drinks containing alcohol do you have on a typical day when you are drinking?: 5 or 6 3. How often do you have six or more drinks on one occasion?: Weekly AUDIT-C Score: 9 4. How often during the last year have you found that you were not able to stop drinking once you had started?: Never 5. How often during the last year have you failed to do what was normally expected from you becasue of drinking?: Less than monthly 6. How often during the last year have you needed a first drink in the morning to get yourself going after a heavy drinking session?: Never 7. How often during the last year have you had a feeling of guilt of remorse after drinking?: Weekly 8. How often during the last year have you been unable to remember what happened the night before because you had been drinking?: Never 9. Have you or someone else been injured as a result of your drinking?: No 10. Has a relative or friend or a doctor or another health worker been concerned about your drinking or suggested you cut down?: No Alcohol Use Disorder Identification Test Final Score (AUDIT): 13 Intervention/Follow-up: Alcohol Education Substance Abuse History in the last 12 months:  Yes.   Consequences of Substance Abuse: Medical Consequences:  reviewed Legal Consequences:  reviewed Family Consequences:  reviewed Previous Psychotropic Medications: Yes  Psychological Evaluations: Yes  Past Medical History:  Past Medical History:  Diagnosis Date  . Depression   .  Hypertension    History reviewed. No pertinent surgical history. Family History: History reviewed. No pertinent family history. Family Psychiatric  History: Denies Tobacco Screening: Have you used any form of tobacco in the last 30 days? (Cigarettes, Smokeless Tobacco, Cigars, and/or Pipes): Yes Tobacco use, Select all that apply: 5 or more cigarettes per day Are you interested in Tobacco Cessation Medications?: Yes, will notify MD for an order Counseled patient on smoking cessation including recognizing danger situations, developing coping skills and basic information about quitting provided: Refused/Declined practical counseling Social History:  Social History   Substance and Sexual Activity  Alcohol Use Yes     Social History   Substance and Sexual Activity  Drug Use Not Currently    Additional Social History:                           Allergies:  No Known Allergies Lab Results:  Results for orders placed or performed during the hospital encounter of 10/03/17 (from the past 48 hour(s))  Hemoglobin A1c     Status: Abnormal   Collection Time: 10/04/17  6:25 AM  Result Value Ref Range  Hgb A1c MFr Bld 5.7 (H) 4.8 - 5.6 %    Comment: (NOTE) Pre diabetes:          5.7%-6.4% Diabetes:              >6.4% Glycemic control for   <7.0% adults with diabetes    Mean Plasma Glucose 116.89 mg/dL    Comment: Performed at [facility], [address] [place], [place] [address]  Lipid panel     Status: Abnormal   Collection Time: 10/04/17  6:25 AM  Result Value Ref Range   Cholesterol 221 (H) 0 - 200 mg/dL   Triglycerides 604125 <540<150 mg/dL   HDL 57 >98>40 mg/dL   Total CHOL/HDL Ratio 3.9 RATIO   VLDL 25 0 - 40 mg/dL   LDL Cholesterol 119139 (H) 0 - 99 mg/dL    Comment:        Total Cholesterol/HDL:CHD Risk Coronary Heart Disease Risk Table                     Men   Women  1/2 Average Risk   3.4   3.3  Average Risk       5.0   4.4  2 X Average Risk   9.6   7.1  3 X  Average Risk  23.4   11.0        Use the calculated Patient Ratio above and the CHD Risk Table to determine the patient's CHD Risk.        ATP III CLASSIFICATION (LDL):  <100     mg/dL   Optimal  147-829100-129  mg/dL   Near or Above                    Optimal  130-159  mg/dL   Borderline  562-130160-189  mg/dL   High  >865>190     mg/dL   Very High Performed at [facility], [address] [place], [place] [address]   TSH     Status: None   Collection Time: 10/04/17  6:25 AM  Result Value Ref Range   TSH 4.175 0.350 - 4.500 uIU/mL    Comment: Performed by a 3rd Generation assay with a functional sensitivity of <=0.01 uIU/mL. Performed at [facility], [address] [place], [place] [address]     Blood Alcohol level:  Lab Results  Component Value Date   ETH <10 10/03/2017    Metabolic Disorder Labs:  Lab Results  Component Value Date   HGBA1C 5.7 (H) 10/04/2017   MPG 116.89 10/04/2017   No results found for: PROLACTIN Lab Results  Component Value Date   CHOL 221 (H) 10/04/2017   TRIG 125 10/04/2017   HDL 57 10/04/2017   CHOLHDL 3.9 10/04/2017   VLDL 25 10/04/2017   LDLCALC 139 (H) 10/04/2017    Current Medications: Current Facility-Administered Medications  Medication Dose Route Frequency Provider Last Rate Last Dose  . acetaminophen (TYLENOL) tablet 650 mg  650 mg Oral Q6H PRN Jackelyn PolingBerry, Jason A, NP      . alum & mag hydroxide-simeth (MAALOX/MYLANTA) 200-200-20 MG/5ML suspension 30 mL  30 mL Oral Q4H PRN Nira ConnBerry, Jason A, NP      . amoxicillin-clavulanate (AUGMENTIN XR) 1000-62.5 MG per 12 hr tablet 2 tablet  2 tablet Oral Q12H Nira ConnBerry, Jason A, NP   2 tablet at 10/04/17 0817  . azithromycin (ZITHROMAX) tablet 250 mg  250 mg Oral Daily Nira ConnBerry, Jason  A, NP      . hydrochlorothiazide (HYDRODIURIL) tablet 25 mg  25 mg Oral Daily Nira Conn A, NP   25 mg at 10/04/17 1610  . hydrOXYzine (ATARAX/VISTARIL) tablet 25 mg  25 mg Oral Q6H PRN Nira Conn  A, NP      . lisinopril (PRINIVIL,ZESTRIL) tablet 5 mg  5 mg Oral Daily Antonieta Pert, MD   5 mg at 10/04/17 9604  . loperamide (IMODIUM) capsule 2-4 mg  2-4 mg Oral PRN Nira Conn A, NP      . LORazepam (ATIVAN) tablet 1 mg  1 mg Oral Q6H PRN Nira Conn A, NP      . LORazepam (ATIVAN) tablet 1 mg  1 mg Oral QID Nira Conn A, NP   1 mg at 10/04/17 1154   Followed by  . [START ON 10/05/2017] LORazepam (ATIVAN) tablet 1 mg  1 mg Oral TID Jackelyn Poling, NP       Followed by  . [START ON 10/06/2017] LORazepam (ATIVAN) tablet 1 mg  1 mg Oral BID Jackelyn Poling, NP       Followed by  . [START ON 10/07/2017] LORazepam (ATIVAN) tablet 1 mg  1 mg Oral Daily Nira Conn A, NP      . magnesium hydroxide (MILK OF MAGNESIA) suspension 30 mL  30 mL Oral Daily PRN Nira Conn A, NP      . nicotine (NICODERM CQ - dosed in mg/24 hours) patch 21 mg  21 mg Transdermal Daily Antonieta Pert, MD   21 mg at 10/04/17 0746  . ondansetron (ZOFRAN-ODT) disintegrating tablet 4 mg  4 mg Oral Q6H PRN Nira Conn A, NP      . sertraline (ZOLOFT) tablet 25 mg  25 mg Oral Daily Antonieta Pert, MD   25 mg at 10/04/17 5409  . thiamine (VITAMIN B-1) tablet 100 mg  100 mg Oral Daily Nira Conn A, NP   100 mg at 10/04/17 0746  . traZODone (DESYREL) tablet 50 mg  50 mg Oral QHS,MR X 1 Nira Conn A, NP       PTA Medications: No medications prior to admission.    Musculoskeletal: Strength & Muscle Tone: within normal limits Gait & Station: normal Patient leans: N/A  Psychiatric Specialty Exam: Physical Exam  Nursing note and vitals reviewed. Constitutional: He is oriented to person, place, and time. He appears well-developed and well-nourished.  Cardiovascular: Normal rate.  Respiratory: Effort normal.  Musculoskeletal: Normal range of motion.  Neurological: He is alert and oriented to person, place, and time.  Skin: Skin is warm.    Review of Systems  Constitutional: Negative.   HENT: Negative.    Eyes: Negative.   Respiratory: Negative.   Cardiovascular: Negative.   Gastrointestinal: Negative.   Genitourinary: Negative.   Musculoskeletal: Negative.   Skin: Negative.   Neurological: Negative.   Endo/Heme/Allergies: Negative.   Psychiatric/Behavioral: Positive for depression, substance abuse and suicidal ideas. The patient is nervous/anxious.     Blood pressure (!) 160/99, pulse 90, temperature 98.9 F (37.2 C), temperature source Oral, resp. rate 18, height 6' (1.829 m), weight 98 kg (216 lb).Body mass index is 29.29 kg/m.  General Appearance: Casual  Eye Contact:  Minimal  Speech:  Clear and Coherent and Normal Rate  Volume:  Normal  Mood:  Depressed  Affect:  Congruent  Thought Process:  Goal Directed and Descriptions of Associations: Intact  Orientation:  Full (Time, Place, and Person)  Thought Content:  WDL  Suicidal Thoughts:  Yes.  with intent/plan  Homicidal Thoughts:  No  Memory:  Immediate;   Fair Recent;   Fair Remote;   Fair  Judgement:  Impaired  Insight:  Lacking  Psychomotor Activity:  Increased  Concentration:  Concentration: Fair and Attention Span: Good  Recall:  Good  Fund of Knowledge:  Good  Language:  Good  Akathisia:  No  Handed:  Right  AIMS (if indicated):     Assets:  Resilience  ADL's:  Intact  Cognition:  WNL  Sleep:  Number of Hours: 5    Treatment Plan Summary: Daily contact with patient to assess and evaluate symptoms and progress in treatment, Medication management and Plan is to:  -See MAR and SRA for medication management -Encourage group therapy participation  Observation Level/Precautions:  15 minute checks  Laboratory:  Reviewed  Psychotherapy:  Group therapy  Medications:  See [facility]  Consultations:  As needed  Discharge Concerns:  Compliance  Estimated LOS: 3-5 Days  Other:  Admit to [address]   Physician Treatment Plan for Primary Diagnosis: Severe recurrent major depression without psychotic features (HCC) Long  Term Goal(s): Improvement in symptoms so as ready for discharge  Short Term Goals: Ability to identify changes in lifestyle to reduce recurrence of condition will improve, Ability to verbalize feelings will improve, Ability to disclose and discuss suicidal ideas and Ability to demonstrate self-control will improve  Physician Treatment Plan for Secondary Diagnosis: Principal Problem:   Severe recurrent major depression without psychotic features (HCC)  Long Term Goal(s): Improvement in symptoms so as ready for discharge  Short Term Goals: Ability to maintain clinical measurements within normal limits will improve, Compliance with prescribed medications will improve and Ability to identify triggers associated with substance abuse/mental health issues will improve  I certify that inpatient services furnished can reasonably be expected to improve the patient's condition.    Maryfrances Bunnell, FNP 6/6/201912:58 PM

## 2017-10-04 NOTE — Progress Notes (Signed)
   10/04/17 0617  Vital Signs  Pulse Rate 82  Resp 18  BP (!) 178/116  BP Location Right Arm  BP Method Automatic  Patient Position (if appropriate) Sitting    Pt's BP remains elevated this a.m. Pt is asymptomatic at this time. Pt states he can't remember BP med that he was on. Pt states he does not take at home. Provider made aware. Scheduled hydrodiuril given early. Will reassessed.

## 2017-10-04 NOTE — [facility] Group Notes (Signed)
Adult Psychoeducational Group Note  Date:  10/04/2017 Time:  9:08 PM  Group Topic/Focus:  Wrap-Up Group:   The focus of this group is to help patients review their daily goal of treatment and discuss progress on daily workbooks.  Participation Level:  Active  Participation Quality:  Appropriate and Attentive  Affect:  Appropriate  Cognitive:  Alert and Appropriate  Insight: Appropriate and Good  Engagement in Group:  Engaged  Modes of Intervention:  Discussion and Education  Additional Comments:  Pt attended and participated in wrap up group this evening. Pt woke up for the day which was a "first step" for them. Pt goal while they are here is to figure out out why they do the things that they do.    Chrisandra NettersOctavia A Kjuan Seipp 10/04/2017, 9:08 PM

## 2017-10-04 NOTE — Progress Notes (Signed)
Pt presents with a flat affect and depressed mood. Pt expressed ongoing depression and anxiety. Pt expressed to writer that he tried to hang himself yesterday after losing hope. Pt reports no family support and concerns about how he will be able to pay his rent since he is unemployed. Pt endorses SI with no plan or intent. Pt denies HI. Pt hypertensive this am. MD made aware. B/p reassessed and decreased. Pt denies any cardiac symptoms.   Pt educated on new medications. Medications administered as ordered per MD. Verbal support provided. Suicide risk assessment completed and pt identified as moderate risk. 15 minute checks performed for safety. Pt encouraged to complete suicide safety plan worksheet. Environmental checks performed every shift. Pt agrees to speak with staff if he have an urge to self harm.   Pt compliant with tx plan.

## 2017-10-04 NOTE — Plan of Care (Signed)
  Problem: Self-Concept: Goal: Ability to disclose and discuss suicidal ideas will improve Outcome: Progressing   Problem: Coping: Goal: Will verbalize feelings Outcome: Progressing

## 2017-10-04 NOTE — [facility] Suicide Risk Assessment (Signed)
[facility] Admission Suicide Risk Assessment   Nursing information obtained from:  Patient Demographic factors:  Male, Caucasian, Low socioeconomic status Current Mental Status:  Suicidal ideation indicated by patient, Suicide plan, Self-harm thoughts, Self-harm behaviors Loss Factors:  Financial problems / change in socioeconomic status Historical Factors:  Prior suicide attempts Risk Reduction Factors:  Positive coping skills or problem solving skills  Total Time spent with patient: 45 minutes Principal Problem: <principal problem not specified> Diagnosis:   Patient Active Problem List   Diagnosis Date Noted  . Severe recurrent major depression without psychotic features (HCC) [F33.2] 10/03/2017   Subjective Data: Patient is seen and examined.  Patient is a 62 year old male with a past psychiatric history significant for major depression as well as alcohol use disorder.  He presented to the [facility] emergency department yesterday with suicidal ideation.  The patient presented with symptoms of depression including helplessness, hopelessness, worthlessness, fatigue, suicidal ideation.  The patient admitted that he had attempted to hang himself 1 to 2 days prior to admission.  He had a CT scan of the neck which was essentially negative.  He also complained of a cough, and was found to have a pneumonia by chest x-ray.  He stated that he his depression had worsened over the last 7 months.  He had been sober for approximately 2-1/2 years, but then started drinking approximately 7 months ago.  He stated since then his depression worsened.  He stated that he had no friends, no family contacts, and had recently lost his part-time job.  This put his housing in some significant problem.  He admitted to a previous suicide attempt in 2015.  He was hospitalized at that time.  He is unsure what medication he took at that time, but believes it may have been Zoloft.  He stated that after his hospitalization he was  unable to afford that medication.  He was admitted to the hospital for evaluation and stabilization.  Continued Clinical Symptoms:  Alcohol Use Disorder Identification Test Final Score (AUDIT): 13 The "Alcohol Use Disorders Identification Test", Guidelines for Use in Primary Care, Second Edition.  World Science writer [facility]). Score between 0-7:  no or low risk or alcohol related problems. Score between 8-15:  moderate risk of alcohol related problems. Score between 16-19:  high risk of alcohol related problems. Score 20 or above:  warrants further diagnostic evaluation for alcohol dependence and treatment.   CLINICAL FACTORS:   Depression:   Anhedonia Comorbid alcohol abuse/dependence Hopelessness Impulsivity Insomnia Alcohol/Substance Abuse/Dependencies   Musculoskeletal: Strength & Muscle Tone: within normal limits Gait & Station: normal Patient leans: N/A  Psychiatric Specialty Exam: Physical Exam  Nursing note and vitals reviewed. Constitutional: He is oriented to person, place, and time. He appears well-developed and well-nourished.  HENT:  Head: Normocephalic and atraumatic.  Respiratory: Effort normal.  Musculoskeletal: Normal range of motion.  Neurological: He is alert and oriented to person, place, and time.    ROS  Blood pressure (!) 180/108, pulse 83, temperature 98.9 F (37.2 C), temperature source Oral, resp. rate 18, height 6' (1.829 m), weight 98 kg (216 lb).Body mass index is 29.29 kg/m.  General Appearance: Casual  Eye Contact:  Minimal  Speech:  Normal Rate  Volume:  Decreased  Mood:  Depressed  Affect:  Congruent  Thought Process:  Coherent  Orientation:  Full (Time, Place, and Person)  Thought Content:  Logical  Suicidal Thoughts:  Yes.  with intent/plan  Homicidal Thoughts:  No  Memory:  Immediate;  Fair Recent;   Fair Remote;   Fair  Judgement:  Impaired  Insight:  Lacking  Psychomotor Activity:  Increased  Concentration:   Concentration: Fair and Attention Span: Good  Recall:  Good  Fund of Knowledge:  Good  Language:  Good  Akathisia:  Negative  Handed:  Right  AIMS (if indicated):     Assets:  Resilience  ADL's:  Intact  Cognition:  WNL  Sleep:  Number of Hours: 5      COGNITIVE FEATURES THAT CONTRIBUTE TO RISK:  None    SUICIDE RISK:   Moderate:  Frequent suicidal ideation with limited intensity, and duration, some specificity in terms of plans, no associated intent, good self-control, limited dysphoria/symptomatology, some risk factors present, and identifiable protective factors, including available and accessible social support.  PLAN OF CARE: Patient seen and examined.  Patient is a 62 year old male with the above-stated past psychiatric history who was admitted after a suicide attempt by hanging as well as relapse of alcohol.  He will be admitted to the behavioral health hospital.  He will be placed on 15-minute precautions.  He will be placed in lorazepam detox protocol for the alcohol.  He will be started on Zoloft 25 mg p.o. daily for depression.  He will also receive thiamine as well as B vitamins.  He was found to have a pneumonia by chest x-ray yesterday, and continues on azithromycin as well as Augmentin.  He will be integrated into the milieu.  He will be encouraged to attend groups.  He will be encouraged to develop better coping skills.  He will meet with social work as well as attending groups.  His blood pressure is elevated and he will be started on lisinopril.  I certify that inpatient services furnished can reasonably be expected to improve the patient's condition.   Antonieta PertGreg Lawson Clary, MD 10/04/2017, 8:31 AM

## 2017-10-05 DIAGNOSIS — I10 Essential (primary) hypertension: Secondary | ICD-10-CM

## 2017-10-05 DIAGNOSIS — F10239 Alcohol dependence with withdrawal, unspecified: Secondary | ICD-10-CM

## 2017-10-05 MED ORDER — LORAZEPAM 0.5 MG PO TABS: 0.5000 mg | ORAL_TABLET | Freq: Two times a day (BID) | ORAL | Status: DC

## 2017-10-05 MED ORDER — LORAZEPAM 0.5 MG PO TABS
0.5000 mg | ORAL_TABLET | Freq: Three times a day (TID) | ORAL | Status: DC
  Administered 2017-10-05: 0.5 mg via ORAL
  Filled 2017-10-05: qty 1

## 2017-10-05 MED ORDER — BUSPIRONE HCL 5 MG PO TABS
5.0000 mg | ORAL_TABLET | Freq: Two times a day (BID) | ORAL | Status: DC
  Administered 2017-10-05 – 2017-10-06 (×2): 5 mg via ORAL
  Filled 2017-10-05 (×4): qty 1

## 2017-10-05 MED ORDER — LORAZEPAM 0.5 MG PO TABS: 0.5000 mg | ORAL_TABLET | Freq: Every day | ORAL | Status: DC

## 2017-10-05 MED ORDER — LISINOPRIL 5 MG PO TABS
5.0000 mg | ORAL_TABLET | Freq: Once | ORAL | Status: AC
  Administered 2017-10-05: 5 mg via ORAL
  Filled 2017-10-05: qty 1

## 2017-10-05 MED ORDER — LORAZEPAM 0.5 MG PO TABS: 0.5000 mg | ORAL_TABLET | Freq: Four times a day (QID) | ORAL | Status: AC | PRN

## 2017-10-05 MED ORDER — ESCITALOPRAM OXALATE 10 MG PO TABS
10.0000 mg | ORAL_TABLET | Freq: Every day | ORAL | Status: DC
  Administered 2017-10-05 – 2017-10-06 (×2): 10 mg via ORAL
  Filled 2017-10-05 (×3): qty 1

## 2017-10-05 MED ORDER — LISINOPRIL 10 MG PO TABS
10.0000 mg | ORAL_TABLET | Freq: Every day | ORAL | Status: DC
  Administered 2017-10-06: 10 mg via ORAL
  Filled 2017-10-05 (×2): qty 1

## 2017-10-05 NOTE — [facility] Group Notes (Signed)
  [facility]  Date/Time: 10/05/17, 1315  Type of Therapy/Topic:  Group Therapy:  Emotion Regulation  Participation Level:  Active   Mood:pleasant  Description of Group:    The purpose of this group is to assist patients in learning to regulate negative emotions and experience positive emotions. Patients will be guided to discuss ways in which they have been vulnerable to their negative emotions. These vulnerabilities will be juxtaposed with experiences of positive emotions or situations, and patients challenged to use positive emotions to combat negative ones. Special emphasis will be placed on coping with negative emotions in conflict situations, and patients will process healthy conflict resolution skills.  Therapeutic Goals: 1. Patient will identify two positive emotions or experiences to reflect on in order to balance out negative emotions:  2. Patient will label two or more emotions that they find the most difficult to experience:  3. Patient will be able to demonstrate positive conflict resolution skills through discussion or role plays:   Summary of Patient Progress:Pt identified depression as an emotion that is difficult for him to experience.  Pt was active in group discussion regarding identification of skills to manage difficult emotions.         Therapeutic Modalities:   Cognitive Behavioral Therapy Feelings Identification Dialectical Behavioral Therapy  Daleen SquibbGreg Ekaterini Capitano, LCSW

## 2017-10-05 NOTE — Progress Notes (Signed)
Recreation Therapy Notes  Date: 6.7.19 Time: 0930 Location: [address] Dayroom  Group Topic: Stress Management  Goal Area(s) Addresses:  Patient will verbalize importance of using healthy stress management.  Patient will identify positive emotions associated with healthy stress management.   Intervention: Stress Management  Activity :  LRT introduced the stress management technique of meditation.  LRT played that focused on being resilient in the face of struggle.  Patients were to follow along as the meditation played to engage in the activity.  Education:  Stress Management, Discharge Planning.   Education Outcome: Acknowledges edcuation/In group clarification offered/Needs additional education  Clinical Observations/Feedback: Pt did not attend group.    Caroll RancherMarjette Everlena Mackley, LRT/CTRS         Caroll RancherLindsay, Consandra Laske A 10/05/2017 12:05 PM

## 2017-10-05 NOTE — Progress Notes (Signed)
[facility]  10/05/2017 2:08 PM Roque LiasRobert Hemler  MRN:  [id number] Subjective: Patient is seen and examined.  Patient is a 62 year old male with a past psychiatric history significant for major depression as well as alcohol use disorder.  He is seen in follow-up.  He stated he was having a bad morning today.  He remembered that Zoloft was not effective in the past and wanted to discuss changing his medicines.  He has attended groups, and that seems to be going a little bit better.  We discussed options for medications.  He is agreed to a trial of Lexapro.  We also discussed the possibility of adding BuSpar as for anxiety.  He characterizes depression is more and anxious form than anything else.  He stated his suicidal ideation had decreased. Principal Problem: Severe recurrent major depression without psychotic features (HCC) Diagnosis:   Patient Active Problem List   Diagnosis Date Noted  . 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 Essential hypertension [I10]   . Severe recurrent major depression without psychotic features (HCC) [F33.2] 10/03/2017   Total Time spent with patient: 20 minutes  Past Psychiatric History: See admission H&P  Past Medical History:  Past Medical History:  Diagnosis Date  . Depression   . Hypertension    History reviewed. No pertinent surgical history. Family History: History reviewed. No pertinent family history. Family Psychiatric  History: See admission H&P Social History:  Social History   Substance and Sexual Activity  Alcohol Use Yes     Social History   Substance and Sexual Activity  Drug Use Not Currently    Social History   Socioeconomic History  . Marital status: Single    Spouse name: Not on file  . Number of children: Not on file  . Years of education: Not on file  . Highest education level: Not on file  Occupational History  . Not on file  Social Needs  . Financial resource strain: Not on file   . Food insecurity:    Worry: Not on file    Inability: Not on file  . Transportation needs:    Medical: Not on file    Non-medical: Not on file  Tobacco Use  . Smoking status: Current Every Day Smoker    Types: Cigarettes  . Smokeless tobacco: Never Used  Substance and Sexual Activity  . Alcohol use: Yes  . Drug use: Not Currently  . Sexual activity: Not Currently  Lifestyle  . Physical activity:    Days per week: Not on file    Minutes per session: Not on file  . Stress: Not on file  Relationships  . Social connections:    Talks on phone: Not on file    Gets together: Not on file    Attends religious service: Not on file    Active member of club or organization: Not on file    Attends meetings of clubs or organizations: Not on file    Relationship status: Not on file  Other Topics Concern  . Not on file  Social History Narrative  . Not on file   Additional Social History:                         Sleep: Fair  Appetite:  Good  Current Medications: Current Facility-Administered Medications  Medication Dose Route Frequency Provider Last Rate Last Dose  . acetaminophen (TYLENOL) tablet 650 mg  650 mg Oral Q6H PRN  Jackelyn Poling, NP      . alum & mag hydroxide-simeth (MAALOX/MYLANTA) 200-200-20 MG/5ML suspension 30 mL  30 mL Oral Q4H PRN Nira Conn A, NP      . amoxicillin-clavulanate (AUGMENTIN XR) 1000-62.5 MG per 12 hr tablet 2 tablet  2 tablet Oral Q12H Nira Conn A, NP   2 tablet at 10/05/17 0826  . azithromycin (ZITHROMAX) tablet 250 mg  250 mg Oral Daily Nira Conn A, NP   250 mg at 10/04/17 1637  . busPIRone (BUSPAR) tablet 5 mg  5 mg Oral BID Antonieta Pert, MD      . escitalopram (LEXAPRO) tablet 10 mg  10 mg Oral Daily Antonieta Pert, MD      . hydrochlorothiazide (HYDRODIURIL) tablet 25 mg  25 mg Oral Daily Nira Conn A, NP   25 mg at 10/05/17 9604  . hydrOXYzine (ATARAX/VISTARIL) tablet 25 mg  25 mg Oral Q6H PRN Nira Conn A, NP       . lisinopril (PRINIVIL,ZESTRIL) tablet 5 mg  5 mg Oral Daily Antonieta Pert, MD   5 mg at 10/05/17 0827  . loperamide (IMODIUM) capsule 2-4 mg  2-4 mg Oral PRN Nira Conn A, NP      . LORazepam (ATIVAN) tablet 0.5 mg  0.5 mg Oral Q6H PRN Antonieta Pert, MD      . LORazepam (ATIVAN) tablet 0.5 mg  0.5 mg Oral TID Antonieta Pert, MD   0.5 mg at 10/05/17 1159   Followed by  . [START ON 10/06/2017] LORazepam (ATIVAN) tablet 0.5 mg  0.5 mg Oral BID Antonieta Pert, MD       Followed by  . [START ON 10/07/2017] LORazepam (ATIVAN) tablet 0.5 mg  0.5 mg Oral Daily Antonieta Pert, MD      . magnesium hydroxide (MILK OF MAGNESIA) suspension 30 mL  30 mL Oral Daily PRN Nira Conn A, NP      . nicotine (NICODERM CQ - dosed in mg/24 hours) patch 21 mg  21 mg Transdermal Daily Antonieta Pert, MD   21 mg at 10/05/17 0829  . ondansetron (ZOFRAN-ODT) disintegrating tablet 4 mg  4 mg Oral Q6H PRN Nira Conn A, NP      . thiamine (VITAMIN B-1) tablet 100 mg  100 mg Oral Daily Nira Conn A, NP   100 mg at 10/05/17 0827  . traZODone (DESYREL) tablet 50 mg  50 mg Oral QHS,MR X 1 Nira Conn A, NP   50 mg at 10/04/17 2049    Lab Results:  Results for orders placed or performed during the hospital encounter of 10/03/17 (from the past 48 hour(s))  Hemoglobin A1c     Status: Abnormal   Collection Time: 10/04/17  6:25 AM  Result Value Ref Range   Hgb A1c MFr Bld 5.7 (H) 4.8 - 5.6 %    Comment: (NOTE) Pre diabetes:          5.7%-6.4% Diabetes:              >6.4% Glycemic control for   <7.0% adults with diabetes    Mean Plasma Glucose 116.89 mg/dL    Comment: Performed at [facility], [address] [place], [place] [address]  Lipid panel     Status: Abnormal   Collection Time: 10/04/17  6:25 AM  Result Value Ref Range   Cholesterol 221 (H) 0 - 200 mg/dL   Triglycerides 119 <147 mg/dL   HDL 57 >  40 mg/dL   Total CHOL/HDL Ratio 3.9 RATIO   VLDL 25 0 - 40 mg/dL   LDL  Cholesterol 161 (H) 0 - 99 mg/dL    Comment:        Total Cholesterol/HDL:CHD Risk Coronary Heart Disease Risk Table                     Men   Women  1/2 Average Risk   3.4   3.3  Average Risk       5.0   4.4  2 X Average Risk   9.6   7.1  3 X Average Risk  23.4   11.0        Use the calculated Patient Ratio above and the CHD Risk Table to determine the patient's CHD Risk.        ATP III CLASSIFICATION (LDL):  <100     mg/dL   Optimal  096-045  mg/dL   Near or Above                    Optimal  130-159  mg/dL   Borderline  409-811  mg/dL   High  >914     mg/dL   Very High Performed at [facility], [address] [place], [place] [address]   TSH     Status: None   Collection Time: 10/04/17  6:25 AM  Result Value Ref Range   TSH 4.175 0.350 - 4.500 uIU/mL    Comment: Performed by a 3rd Generation assay with a functional sensitivity of <=0.01 uIU/mL. Performed at [facility], [address] [place], [place] [address]     Blood Alcohol level:  Lab Results  Component Value Date   ETH <10 10/03/2017    Metabolic Disorder Labs: Lab Results  Component Value Date   HGBA1C 5.7 (H) 10/04/2017   MPG 116.89 10/04/2017   No results found for: PROLACTIN Lab Results  Component Value Date   CHOL 221 (H) 10/04/2017   TRIG 125 10/04/2017   HDL 57 10/04/2017   CHOLHDL 3.9 10/04/2017   VLDL 25 10/04/2017   LDLCALC 139 (H) 10/04/2017    Physical Findings: AIMS: Facial and Oral Movements Muscles of Facial Expression: None, normal Lips and Perioral Area: None, normal Jaw: None, normal Tongue: None, normal,Extremity Movements Upper (arms, wrists, hands, fingers): None, normal Lower (legs, knees, ankles, toes): None, normal, Trunk Movements Neck, shoulders, hips: None, normal, Overall Severity Severity of abnormal movements (highest score from questions above): None, normal Incapacitation due to abnormal movements: None,  normal Patient's awareness of abnormal movements (rate only patient's report): No Awareness, Dental Status Current problems with teeth and/or dentures?: No Does patient usually wear dentures?: No  CIWA:  CIWA-Ar Total: 1 COWS:     Musculoskeletal: Strength & Muscle Tone: within normal limits Gait & Station: normal Patient leans: N/A  Psychiatric Specialty Exam: Physical Exam  Constitutional: He is oriented to person, place, and time. He appears well-developed and well-nourished.  HENT:  Head: Normocephalic and atraumatic.  Respiratory: Effort normal.  Musculoskeletal: Normal range of motion.  Neurological: He is alert and oriented to person, place, and time.    ROS  Blood pressure (!) 139/92, pulse 80, temperature 97.6 F (36.4 C), temperature source Oral, resp. rate 20, height 6' (1.829 m), weight 98 kg (216 lb).Body mass index is 29.29 kg/m.  General Appearance: Casual  Eye Contact:  Fair  Speech:  Slow  Volume:  Decreased  Mood:  Depressed  Affect:  Congruent  Thought Process:  Coherent  Orientation:  Full (Time, Place, and Person)  Thought Content:  Logical  Suicidal Thoughts:  No  Homicidal Thoughts:  No  Memory:  Immediate;   Fair Recent;   Fair Remote;   Fair  Judgement:  Intact  Insight:  Fair  Psychomotor Activity:  Decreased  Concentration:  Concentration: Fair and Attention Span: Fair  Recall:  [employer] of Knowledge:  Fair  Language:  Fair  Akathisia:  Negative  Handed:  Right  AIMS (if indicated):     Assets:  Desire for Improvement Physical Health  ADL's:  Intact  Cognition:  WNL  Sleep:  Number of Hours: 6.25     Treatment Plan Summary: Daily contact with patient to assess and evaluate symptoms and progress in treatment, Medication management and Plan Patient is seen and examined.  Patient is a 62 year old male with the above-stated past psychiatric history seen in follow-up.  I will go on and stop the Zoloft today.  We will start Lexapro 10  mg p.o. daily, and I will also add BuSpar 5 mg p.o. twice daily.  His blood pressure is still elevated, and Emina increase his lisinopril to 10 mg p.o. daily.  We will get a chemistry panel on 10/07/2017.  He continues on the alcohol detox protocol.  His pneumonia continues to be treated with azithromycin as well as Augmentin XR.  No other changes to his medications.  Antonieta Pert, MD 10/05/2017, 2:08 PM

## 2017-10-05 NOTE — Plan of Care (Signed)
Patient self inventory: Patient slept fair last night, sleep medication was requested and it did help. Appetite has been fair the last 24 hours, energy level normal, and concentration poor. Patient has rated depression 8 or 9, hopelessness 8 or 9, and anxiety 8 out of 10. Patient claims not to be withdrawing, but is experiencing runny nose. Patient endorses physical pain in the neck rating 3 out of 10. Patient's goal is to "keep trying to move forward by attending classes and talking to the doctor." Patient endorses passive SI with no plan. Verbally contracts for safety. Denies HI/AVH. Patient compliant with medication administration. Safety maintained with 15 minute checks.  Problem: Medication: Goal: Compliance with prescribed medication regimen will improve Outcome: Progressing

## 2017-10-05 NOTE — Tx Team (Signed)
Interdisciplinary Treatment and Diagnostic Plan Update  10/05/2017 Time of Session: 0923 David Small MRN: [id number]  Principal Diagnosis: Severe recurrent major depression without psychotic features [facility])  Secondary Diagnoses: Principal Problem:   Severe recurrent major depression without psychotic features (HCC) Active Problems:   Alcohol dependence with alcohol-induced mood disorder (HCC)   Essential hypertension   Current Medications:  Current Facility-Administered Medications  Medication Dose Route Frequency Provider Last Rate Last Dose  . acetaminophen (TYLENOL) tablet 650 mg  650 mg Oral Q6H PRN Jackelyn Poling, NP      . alum & mag hydroxide-simeth (MAALOX/MYLANTA) 200-200-20 MG/5ML suspension 30 mL  30 mL Oral Q4H PRN Nira Conn A, NP      . amoxicillin-clavulanate (AUGMENTIN XR) 1000-62.5 MG per 12 hr tablet 2 tablet  2 tablet Oral Q12H Nira Conn A, NP   2 tablet at 10/05/17 0826  . azithromycin (ZITHROMAX) tablet 250 mg  250 mg Oral Daily Nira Conn A, NP   250 mg at 10/04/17 1637  . busPIRone (BUSPAR) tablet 5 mg  5 mg Oral BID Antonieta Pert, MD      . escitalopram (LEXAPRO) tablet 10 mg  10 mg Oral Daily Antonieta Pert, MD      . hydrochlorothiazide (HYDRODIURIL) tablet 25 mg  25 mg Oral Daily Nira Conn A, NP   25 mg at 10/05/17 4098  . hydrOXYzine (ATARAX/VISTARIL) tablet 25 mg  25 mg Oral Q6H PRN Jackelyn Poling, NP      . Melene Muller ON 10/06/2017] lisinopril (PRINIVIL,ZESTRIL) tablet 10 mg  10 mg Oral Daily Antonieta Pert, MD      . lisinopril (PRINIVIL,ZESTRIL) tablet 5 mg  5 mg Oral Once Antonieta Pert, MD      . loperamide (IMODIUM) capsule 2-4 mg  2-4 mg Oral PRN Nira Conn A, NP      . LORazepam (ATIVAN) tablet 0.5 mg  0.5 mg Oral Q6H PRN Antonieta Pert, MD      . LORazepam (ATIVAN) tablet 0.5 mg  0.5 mg Oral TID Antonieta Pert, MD   0.5 mg at 10/05/17 1159   Followed by  . [START ON 10/06/2017] LORazepam (ATIVAN) tablet 0.5 mg  0.5 mg  Oral BID Antonieta Pert, MD       Followed by  . [START ON 10/07/2017] LORazepam (ATIVAN) tablet 0.5 mg  0.5 mg Oral Daily Antonieta Pert, MD      . magnesium hydroxide (MILK OF MAGNESIA) suspension 30 mL  30 mL Oral Daily PRN Nira Conn A, NP      . nicotine (NICODERM CQ - dosed in mg/24 hours) patch 21 mg  21 mg Transdermal Daily Antonieta Pert, MD   21 mg at 10/05/17 0829  . ondansetron (ZOFRAN-ODT) disintegrating tablet 4 mg  4 mg Oral Q6H PRN Nira Conn A, NP      . thiamine (VITAMIN B-1) tablet 100 mg  100 mg Oral Daily Nira Conn A, NP   100 mg at 10/05/17 0827  . traZODone (DESYREL) tablet 50 mg  50 mg Oral QHS,MR X 1 Nira Conn A, NP   50 mg at 10/04/17 2049   PTA Medications: No medications prior to admission.    Patient Stressors: Financial difficulties Medication change or noncompliance Substance abuse  Patient Strengths: Ability for insight Average or above average intelligence Capable of independent living Wellsite geologist fund of knowledge Motivation for treatment/growth  Treatment Modalities: Medication Management, Group therapy, Case management,  1 to 1 session with clinician, Psychoeducation, Recreational therapy.   Physician Treatment Plan for Primary Diagnosis: Severe recurrent major depression without psychotic features (HCC) Long Term Goal(s): Improvement in symptoms so as ready for discharge Improvement in symptoms so as ready for discharge   Short Term Goals: Ability to identify changes in lifestyle to reduce recurrence of condition will improve Ability to verbalize feelings will improve Ability to disclose and discuss suicidal ideas Ability to demonstrate self-control will improve Ability to maintain clinical measurements within normal limits will improve Compliance with prescribed medications will improve Ability to identify triggers associated with substance abuse/mental health issues will improve  Medication Management:  Evaluate patient's response, side effects, and tolerance of medication regimen.  Therapeutic Interventions: 1 to 1 sessions, Unit Group sessions and Medication administration.  Evaluation of Outcomes: Progressing  Physician Treatment Plan for Secondary Diagnosis: Principal Problem:   Severe recurrent major depression without psychotic features (HCC) Active Problems:   Alcohol dependence with alcohol-induced mood disorder (HCC)   Essential hypertension  Long Term Goal(s): Improvement in symptoms so as ready for discharge Improvement in symptoms so as ready for discharge   Short Term Goals: Ability to identify changes in lifestyle to reduce recurrence of condition will improve Ability to verbalize feelings will improve Ability to disclose and discuss suicidal ideas Ability to demonstrate self-control will improve Ability to maintain clinical measurements within normal limits will improve Compliance with prescribed medications will improve Ability to identify triggers associated with substance abuse/mental health issues will improve     Medication Management: Evaluate patient's response, side effects, and tolerance of medication regimen.  Therapeutic Interventions: 1 to 1 sessions, Unit Group sessions and Medication administration.  Evaluation of Outcomes: Progressing   RN Treatment Plan for Primary Diagnosis: Severe recurrent major depression without psychotic features (HCC) Long Term Goal(s): Knowledge of disease and therapeutic regimen to maintain health will improve  Short Term Goals: Ability to identify and develop effective coping behaviors will improve and Compliance with prescribed medications will improve  Medication Management: RN will administer medications as ordered by provider, will assess and evaluate patient's response and provide education to patient for prescribed medication. RN will report any adverse and/or side effects to prescribing provider.  Therapeutic  Interventions: 1 on 1 counseling sessions, Psychoeducation, Medication administration, Evaluate responses to treatment, Monitor vital signs and CBGs as ordered, Perform/monitor CIWA, COWS, AIMS and Fall Risk screenings as ordered, Perform wound care treatments as ordered.  Evaluation of Outcomes: Progressing   LCSW Treatment Plan for Primary Diagnosis: Severe recurrent major depression without psychotic features (HCC) Long Term Goal(s): Safe transition to appropriate next level of care at discharge, Engage patient in therapeutic group addressing interpersonal concerns.  Short Term Goals: Engage patient in aftercare planning with referrals and resources, Increase social support and Increase skills for wellness and recovery  Therapeutic Interventions: Assess for all discharge needs, 1 to 1 time with Social worker, Explore available resources and support systems, Assess for adequacy in community support network, Educate family and significant other(s) on suicide prevention, Complete Psychosocial Assessment, Interpersonal group therapy.  Evaluation of Outcomes: Progressing   Progress in Treatment: Attending groups: Yes. Participating in groups: Yes. Taking medication as prescribed: Yes. Toleration medication: Yes. Family/Significant other contact made: No, will contact:  pt declined consent Patient understands diagnosis: Yes. Discussing patient identified problems/goals with staff: Yes. Medical problems stabilized or resolved: Yes. Denies suicidal/homicidal ideation: Yes. Issues/concerns per patient self-inventory: No. Other: none  New problem(s) identified: No, Describe:  none  New Short Term/Long Term Goal(s):  Patient Goals:  "get on a more even keel"  Discharge Plan or Barriers:   Reason for Continuation of Hospitalization: Depression Medication stabilization Withdrawal symptoms  Estimated Length of Stay:2-4 days   Attendees: Patient:David Small 10/05/2017   Physician:  Dr. Jola Babinski, MD 10/05/2017   Nursing: Dewayne Shorter, RN 10/05/2017   RN Care Manager: 10/05/2017   Social Worker: Daleen Squibb, LCSW 10/05/2017   Recreational Therapist:  10/05/2017   Other:  10/05/2017   Other:  10/05/2017   Other: 10/05/2017      Scribe for Treatment Team: Lorri Frederick, LCSW 10/05/2017 2:19 PM

## 2017-10-06 DIAGNOSIS — F1099 Alcohol use, unspecified with unspecified alcohol-induced disorder: Secondary | ICD-10-CM

## 2017-10-06 DIAGNOSIS — K769 Liver disease, unspecified: Secondary | ICD-10-CM

## 2017-10-06 MED ORDER — LISINOPRIL 10 MG PO TABS
10.0000 mg | ORAL_TABLET | Freq: Once | ORAL | Status: AC
  Administered 2017-10-06: 10 mg via ORAL
  Filled 2017-10-06: qty 1

## 2017-10-06 MED ORDER — LISINOPRIL 20 MG PO TABS
20.0000 mg | ORAL_TABLET | Freq: Every day | ORAL | Status: DC
  Administered 2017-10-07 – 2017-10-08 (×2): 20 mg via ORAL
  Filled 2017-10-06 (×4): qty 1

## 2017-10-06 MED ORDER — ALBUTEROL SULFATE (2.5 MG/3ML) 0.083% IN NEBU
2.5000 mg | INHALATION_SOLUTION | RESPIRATORY_TRACT | Status: DC | PRN
  Administered 2017-10-06: 2.5 mg via RESPIRATORY_TRACT
  Filled 2017-10-06: qty 3

## 2017-10-06 MED ORDER — BUSPIRONE HCL 5 MG PO TABS
5.0000 mg | ORAL_TABLET | Freq: Three times a day (TID) | ORAL | Status: DC
  Administered 2017-10-06 – 2017-10-08 (×6): 5 mg via ORAL
  Filled 2017-10-06 (×12): qty 1

## 2017-10-06 MED ORDER — ESCITALOPRAM OXALATE 20 MG PO TABS
20.0000 mg | ORAL_TABLET | Freq: Every day | ORAL | Status: DC
  Administered 2017-10-07 – 2017-10-12 (×6): 20 mg via ORAL
  Filled 2017-10-06 (×7): qty 1
  Filled 2017-10-06: qty 2
  Filled 2017-10-06: qty 1

## 2017-10-06 MED ORDER — TRAZODONE HCL 100 MG PO TABS
100.0000 mg | ORAL_TABLET | Freq: Every evening | ORAL | Status: DC | PRN
  Administered 2017-10-06: 100 mg via ORAL
  Filled 2017-10-06 (×4): qty 1

## 2017-10-06 NOTE — [facility] Group Notes (Signed)
[facility] Group Notes:  (Nursing/MHT/Case Management/Adjunct)  Date:  10/06/2017  Time:  5:26 PM  Type of Therapy:  Psychoeducational Skills  Participation Level:  Active  Participation Quality:  Appropriate  Affect:  Appropriate  Cognitive:  Alert and Appropriate  Insight:  Good  Engagement in Group:  Engaged  Modes of Intervention:  Activity and Discussion  Summary of Progress/Problems:In this group, we passed a deck of cards around the room. On each card was a question about something meaningful or an icebreaker question. They were encouraged to share with the group and start a discussion related to the question. The patient shared and was actively involved.  Kirstie MirzaJonathan C Soleia Badolato 10/06/2017, 5:26 PM

## 2017-10-06 NOTE — Progress Notes (Signed)
D    Pt has been visible on the milieu this evening   He interacts appropriately with staff and peers    He is pleasant on approach and cooperative   He endorses depression and anxiety but reports improvement A    Verbal support given    Medications administered and effectiveness monitored    Q 15 min checks R    Pt remains safe and contracts for safety

## 2017-10-06 NOTE — [facility] Group Notes (Signed)
Adult Psychoeducational Group Note  Date:  10/06/2017 Time:  9:23 PM  Group Topic/Focus:  Wrap-Up Group:   The focus of this group is to help patients review their daily goal of treatment and discuss progress on daily workbooks.  Participation Level:  Active  Participation Quality:  Appropriate and Attentive  Affect:  Appropriate  Cognitive:  Alert and Appropriate  Insight: Appropriate and Good  Engagement in Group:  Engaged  Modes of Intervention:  Discussion and Education  Additional Comments:  Pt attended and participated in wrap up group this evening. Pt had an okay day, but would not elaborate to Clinical research associatewriter. Pt goal is partially completed, which was to work on their discharge plan.   David NettersOctavia A Christpoher Small 10/06/2017, 9:23 PM

## 2017-10-06 NOTE — Progress Notes (Signed)
Adult Psychoeducational Group Note  Date:  10/06/2017 Time:  6:49 PM  Group Topic/Focus:  Goals Group:   The focus of this group is to help patients establish daily goals to achieve during treatment and discuss how the patient can incorporate goal setting into their daily lives to aide in recovery.  Participation Level:  Active  Participation Quality:  Attentive  Affect:  Appropriate  Cognitive:  Oriented  Insight: Appropriate  Engagement in Group:  Engaged  Modes of Intervention:  Discussion  Additional Comments:  Pt was active in group   HutchinsonWollie, Amatullah Christy Celcia 10/06/2017, 6:49 PM

## 2017-10-06 NOTE — Progress Notes (Signed)
[facility] MD Progress Note  10/06/2017 10:33 AM David Small  MRN:  [id number] Subjective: Patient is seen and examined.  Patient is a 62 year old male with a past psychiatric history significant for major depression as well as alcohol use disorder.  He also has pneumonia.  He is seen in follow-up.  He is doing better today.  He has still not really started making a discharge treatment plan.  We discussed that today.  His cough seems to be improving.  He denied any suicidal ideation.  He said his mood was improving.  He denied any side effects to his current medications. Principal Problem: Severe recurrent major depression without psychotic features (HCC) Diagnosis:   Patient Active Problem List   Diagnosis Date Noted  . 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 Essential hypertension [I10]   . Severe recurrent major depression without psychotic features (HCC) [F33.2] 10/03/2017   Total Time spent with patient: 20 minutes  Past Psychiatric History: See admission H&P  Past Medical History:  Past Medical History:  Diagnosis Date  . Depression   . Hypertension    History reviewed. No pertinent surgical history. Family History: History reviewed. No pertinent family history. Family Psychiatric  History: See admission H&P Social History:  Social History   Substance and Sexual Activity  Alcohol Use Yes     Social History   Substance and Sexual Activity  Drug Use Not Currently    Social History   Socioeconomic History  . Marital status: Single    Spouse name: Not on file  . Number of children: Not on file  . Years of education: Not on file  . Highest education level: Not on file  Occupational History  . Not on file  Social Needs  . Financial resource strain: Not on file  . Food insecurity:    Worry: Not on file    Inability: Not on file  . Transportation needs:    Medical: Not on file    Non-medical: Not on file  Tobacco Use  . Smoking status:  Current Every Day Smoker    Types: Cigarettes  . Smokeless tobacco: Never Used  Substance and Sexual Activity  . Alcohol use: Yes  . Drug use: Not Currently  . Sexual activity: Not Currently  Lifestyle  . Physical activity:    Days per week: Not on file    Minutes per session: Not on file  . Stress: Not on file  Relationships  . Social connections:    Talks on phone: Not on file    Gets together: Not on file    Attends religious service: Not on file    Active member of club or organization: Not on file    Attends meetings of clubs or organizations: Not on file    Relationship status: Not on file  Other Topics Concern  . Not on file  Social History Narrative  . Not on file   Additional Social History:                         Sleep: Fair  Appetite:  Good  Current Medications: Current Facility-Administered Medications  Medication Dose Route Frequency Provider Last Rate Last Dose  . acetaminophen (TYLENOL) tablet 650 mg  650 mg Oral Q6H PRN Nira Conn A, NP      . albuterol (PROVENTIL) (2.5 MG/3ML) 0.083% nebulizer solution 2.5 mg  2.5 mg Nebulization Q4H PRN Antonieta Pert, MD      .  alum & mag hydroxide-simeth (MAALOX/MYLANTA) 200-200-20 MG/5ML suspension 30 mL  30 mL Oral Q4H PRN Nira Conn A, NP      . amoxicillin-clavulanate (AUGMENTIN XR) 1000-62.5 MG per 12 hr tablet 2 tablet  2 tablet Oral Q12H Nira Conn A, NP   2 tablet at 10/05/17 2140  . azithromycin (ZITHROMAX) tablet 250 mg  250 mg Oral Daily Nira Conn A, NP   250 mg at 10/05/17 1654  . busPIRone (BUSPAR) tablet 5 mg  5 mg Oral BID Antonieta Pert, MD   5 mg at 10/06/17 2725  . escitalopram (LEXAPRO) tablet 10 mg  10 mg Oral Daily Antonieta Pert, MD   10 mg at 10/06/17 3664  . hydrochlorothiazide (HYDRODIURIL) tablet 25 mg  25 mg Oral Daily Nira Conn A, NP   25 mg at 10/06/17 0815  . hydrOXYzine (ATARAX/VISTARIL) tablet 25 mg  25 mg Oral Q6H PRN Nira Conn A, NP      .  lisinopril (PRINIVIL,ZESTRIL) tablet 10 mg  10 mg Oral Daily Antonieta Pert, MD   10 mg at 10/06/17 0818  . loperamide (IMODIUM) capsule 2-4 mg  2-4 mg Oral PRN Nira Conn A, NP      . LORazepam (ATIVAN) tablet 0.5 mg  0.5 mg Oral Q6H PRN Antonieta Pert, MD      . magnesium hydroxide (MILK OF MAGNESIA) suspension 30 mL  30 mL Oral Daily PRN Nira Conn A, NP      . nicotine (NICODERM CQ - dosed in mg/24 hours) patch 21 mg  21 mg Transdermal Daily Antonieta Pert, MD   21 mg at 10/06/17 0841  . ondansetron (ZOFRAN-ODT) disintegrating tablet 4 mg  4 mg Oral Q6H PRN Nira Conn A, NP      . thiamine (VITAMIN B-1) tablet 100 mg  100 mg Oral Daily Nira Conn A, NP   100 mg at 10/06/17 0818  . traZODone (DESYREL) tablet 100 mg  100 mg Oral QHS,MR X 1 Quanika Solem, Marlane Mingle, MD        Lab Results: No results found for this or any previous visit (from the past 48 hour(s)).  Blood Alcohol level:  Lab Results  Component Value Date   ETH <10 10/03/2017    Metabolic Disorder Labs: Lab Results  Component Value Date   HGBA1C 5.7 (H) 10/04/2017   MPG 116.89 10/04/2017   No results found for: PROLACTIN Lab Results  Component Value Date   CHOL 221 (H) 10/04/2017   TRIG 125 10/04/2017   HDL 57 10/04/2017   CHOLHDL 3.9 10/04/2017   VLDL 25 10/04/2017   LDLCALC 139 (H) 10/04/2017    Physical Findings: AIMS: Facial and Oral Movements Muscles of Facial Expression: None, normal Lips and Perioral Area: None, normal Jaw: None, normal Tongue: None, normal,Extremity Movements Upper (arms, wrists, hands, fingers): None, normal Lower (legs, knees, ankles, toes): None, normal, Trunk Movements Neck, shoulders, hips: None, normal, Overall Severity Severity of abnormal movements (highest score from questions above): None, normal Incapacitation due to abnormal movements: None, normal Patient's awareness of abnormal movements (rate only patient's report): No Awareness, Dental Status Current  problems with teeth and/or dentures?: No Does patient usually wear dentures?: No  CIWA:  CIWA-Ar Total: 1 COWS:     Musculoskeletal: Strength & Muscle Tone: within normal limits Gait & Station: normal Patient leans: N/A  Psychiatric Specialty Exam: Physical Exam  Nursing note and vitals reviewed. Constitutional: He is oriented to person, place, and time. He  appears well-developed and well-nourished.  HENT:  Head: Normocephalic and atraumatic.  Respiratory: Effort normal. He has wheezes. He has rales.  Neurological: He is alert and oriented to person, place, and time.    ROS  Blood pressure (!) 153/100, pulse 72, temperature 98 F (36.7 C), temperature source Oral, resp. rate 18, height 6' (1.829 m), weight 98 kg (216 lb).Body mass index is 29.29 kg/m.  General Appearance: Casual  Eye Contact:  Fair  Speech:  Normal Rate  Volume:  Normal  Mood:  Anxious  Affect:  Congruent  Thought Process:  Coherent  Orientation:  Full (Time, Place, and Person)  Thought Content:  Logical  Suicidal Thoughts:  No  Homicidal Thoughts:  No  Memory:  Immediate;   Fair Recent;   Fair Remote;   Fair  Judgement:  Intact  Insight:  Lacking  Psychomotor Activity:  Normal  Concentration:  Concentration: Fair and Attention Span: Fair  Recall:  FiservFair  Fund of Knowledge:  Fair  Language:  Fair  Akathisia:  Negative  Handed:  Right  AIMS (if indicated):     Assets:  Communication Skills Desire for Improvement Resilience  ADL's:  Intact  Cognition:  WNL  Sleep:  Number of Hours: 5.5     Treatment Plan Summary: Daily contact with patient to assess and evaluate symptoms and progress in treatment, Medication management and Plan Patient is seen and examined.  Patient is a 62 year old male with the above-stated past psychiatric history seen in follow-up.  He continues to slowly improve.  I am going to increase his BuSpar to 5 mg p.o. 3 times daily, and increase his S-Citalopram to 20 mg p.o. daily.   His blood pressure remains elevated, so I am going to increase his lisinopril to 20 mg p.o. daily.  He also stated he was not sleeping well, so I am an increase his trazodone 200 mg p.o. nightly.  We spent the majority of today discussing his discharge planning.  I have asked him to become more involved, and to contact his living facility and make sure about these things prior to his discharge.  With regards to his pneumonia his lungs are sounding better on physical examination.  He is having some wheezing, but I think that may be his baseline.  I am going to order albuterol nebulization treatments to see if we can improve that.  We did discuss smoking cessation as well.  No other changes to his medications.  I am going to check chemistries tomorrow morning because of the lisinopril as well as liver issues.  Antonieta PertGreg Lawson Nychelle Cassata, MD 10/06/2017, 10:33 AM

## 2017-10-06 NOTE — [facility] Group Notes (Signed)
LCSW Group Therapy Note  10/06/2017    10:15 - 11:15 AM               Type of Therapy and Topic:  Group Therapy: Anger Cues and Responses  Participation Level:  Active  In this group, patients learned how to recognize the physical, cognitive, emotional, and behavioral responses they have to anger-provoking situations.  They identified a recent time they became angry and how they reacted.  They analyzed how their reaction was possibly beneficial and how it was possibly unhelpful.  The group discussed anger warning signs and how to know when our anger can potentially become a problem. The group will discuss the cycle of anger and how our thoughts impact our feelings which in result affect our behaviors. Patients will explore alternative emotions in addition to feeling anger. Patients will share with the group and learn from CSW but also other patients coping skills that work for others.   Therapeutic Goals: 1. Patients will remember their last incident of anger and how they felt emotionally and physically, what their thoughts were at the time, and how they behaved. 2. Patients will identify how their behavior at that time worked for them, as well as how it worked against them. 3. Patients will explore how their body, mind and feelings play a role with anger. 4. Patients will learn that anger itself is normal and cannot be eliminated, and that healthier reactions can assist with resolving conflict rather than worsening situations. 5. Patients will learn the causes of anger and education on the negative implications anger can have on us overtime.  Summary of Patient Progress:  Patient was not present initially but was meeting with the doctor. Patient was engaged and participated throughout the portion of group he was present for. Patient reports their triggers include: job, people and myself. Patient identified the following to be their warning signs of anger: none. Patient stated "I don't really get  angry often. Patient reports planning to add chilling as a coping skill upon discharge from the hospital.   Therapeutic Modalities:   Cognitive Behavioral Therapy  Shellia CleverlyStephanie N Grethel Zenk, LCSW  10/06/2017 12:16 PM

## 2017-10-06 NOTE — Progress Notes (Signed)
Pt is mildly irritable but cooperative.  He states that he does not feel anxious (when educated about starting buspar), and that he doesn't want to become dependant on a medication that he might not be able to afford.  He states that he still has suicidal thoughts but is able to contract for safety.   A: Support, education, and encouragement provided as needed.  Level 3 checks continued for safety.  R: Pt.  receptive to intervention/s.  Safety maintained.  Joaquin MusicMary Cj Beecher, RN

## 2017-10-07 ENCOUNTER — Inpatient Hospital Stay (HOSPITAL_COMMUNITY)
Admission: AD | Admit: 2017-10-07 | Discharge: 2017-10-07 | Disposition: A | Discharge status: Home / Self Care | Payer: BLUE CROSS/BLUE SHIELD | Source: Intra-hospital | Attending: Psychiatry | Admitting: Psychiatry

## 2017-10-07 ENCOUNTER — Inpatient Hospital Stay (HOSPITAL_COMMUNITY): Payer: BLUE CROSS/BLUE SHIELD

## 2017-10-07 MED ORDER — TRAZODONE HCL 150 MG PO TABS
150.0000 mg | ORAL_TABLET | Freq: Every evening | ORAL | Status: DC | PRN
  Administered 2017-10-07 – 2017-10-08 (×2): 150 mg via ORAL
  Filled 2017-10-07 (×8): qty 1

## 2017-10-07 MED ORDER — LISINOPRIL 20 MG PO TABS: 20.0000 mg | ORAL_TABLET | Freq: Every day | ORAL | 0 refills | Status: AC

## 2017-10-07 MED ORDER — BUSPIRONE HCL 5 MG PO TABS: 5.0000 mg | ORAL_TABLET | Freq: Three times a day (TID) | ORAL | 0 refills | Status: DC

## 2017-10-07 MED ORDER — ESCITALOPRAM OXALATE 20 MG PO TABS: 20.0000 mg | ORAL_TABLET | Freq: Every day | ORAL | 0 refills | Status: AC

## 2017-10-07 MED ORDER — AMOXICILLIN-POT CLAVULANATE ER 1000-62.5 MG PO TB12: 2.0000 | ORAL_TABLET | Freq: Two times a day (BID) | ORAL | 0 refills | Status: AC

## 2017-10-07 MED ORDER — HYDROCHLOROTHIAZIDE 25 MG PO TABS: 25.0000 mg | ORAL_TABLET | Freq: Every day | ORAL | 0 refills | Status: AC

## 2017-10-07 MED ORDER — TRAZODONE HCL 150 MG PO TABS: 150.0000 mg | ORAL_TABLET | Freq: Every evening | ORAL | 0 refills | Status: AC | PRN

## 2017-10-07 MED ORDER — CLONIDINE HCL 0.1 MG PO TABS
0.1000 mg | ORAL_TABLET | Freq: Three times a day (TID) | ORAL | Status: DC | PRN
Start: 2017-10-07 — End: 2017-10-12
  Administered 2017-10-10 – 2017-10-11 (×2): 0.1 mg via ORAL
  Filled 2017-10-07: qty 1

## 2017-10-07 MED ORDER — ACAMPROSATE CALCIUM 333 MG PO TBEC: 333.0000 mg | DELAYED_RELEASE_TABLET | Freq: Three times a day (TID) | ORAL | 0 refills | Status: DC

## 2017-10-07 MED ORDER — ACAMPROSATE CALCIUM 333 MG PO TBEC
333.0000 mg | DELAYED_RELEASE_TABLET | Freq: Three times a day (TID) | ORAL | Status: DC
  Administered 2017-10-07 – 2017-10-08 (×3): 333 mg via ORAL
  Filled 2017-10-07 (×18): qty 1

## 2017-10-07 NOTE — Progress Notes (Signed)
[facility] MD Progress Note  10/07/2017 11:04 AM David Small  MRN:  [id number] Subjective: Patient is seen and examined.  Patient is a 62 year old male with a past psychiatric history significant for major depression as well as alcohol use disorder.  He also has hypertension as well as a pneumonia.  He stated his mood continues to slowly improve.  He is anxious about housing after he leaves.  He stated he has contacted 1 of his friends, but they have not called him back.  His alcohol withdrawal symptoms are stable.  He still anxious.  His blood pressure remains elevated, but he did state that the albuterol nebulization treatments did cough and he was able to get some of the mucus elevated out of his system.  We discussed the possibility of a chest x-ray to follow-up his last with regard to his pneumonia.  He continues on azithromycin as well as Augmentin.  No other complaints today. Principal Problem: Severe recurrent major depression without psychotic features (HCC) Diagnosis:   Patient Active Problem List   Diagnosis Date Noted  . 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 Essential hypertension [I10]   . Severe recurrent major depression without psychotic features (HCC) [F33.2] 10/03/2017   Total Time spent with patient: 20 minutes  Past Psychiatric History: See admission H&P  Past Medical History:  Past Medical History:  Diagnosis Date  . Depression   . Hypertension    History reviewed. No pertinent surgical history. Family History: History reviewed. No pertinent family history. Family Psychiatric  History: See admission H&P Social History:  Social History   Substance and Sexual Activity  Alcohol Use Yes     Social History   Substance and Sexual Activity  Drug Use Not Currently    Social History   Socioeconomic History  . Marital status: Single    Spouse name: Not on file  . Number of children: Not on file  . Years of education: Not on file  .  Highest education level: Not on file  Occupational History  . Not on file  Social Needs  . Financial resource strain: Not on file  . Food insecurity:    Worry: Not on file    Inability: Not on file  . Transportation needs:    Medical: Not on file    Non-medical: Not on file  Tobacco Use  . Smoking status: Current Every Day Smoker    Types: Cigarettes  . Smokeless tobacco: Never Used  Substance and Sexual Activity  . Alcohol use: Yes  . Drug use: Not Currently  . Sexual activity: Not Currently  Lifestyle  . Physical activity:    Days per week: Not on file    Minutes per session: Not on file  . Stress: Not on file  Relationships  . Social connections:    Talks on phone: Not on file    Gets together: Not on file    Attends religious service: Not on file    Active member of club or organization: Not on file    Attends meetings of clubs or organizations: Not on file    Relationship status: Not on file  Other Topics Concern  . Not on file  Social History Narrative  . Not on file   Additional Social History:                         Sleep: Fair  Appetite:  Good  Current Medications: Current Facility-Administered  Medications  Medication Dose Route Frequency Provider Last Rate Last Dose  . acamprosate (CAMPRAL) tablet 333 mg  333 mg Oral TID Antonieta Pertlary, Ammie Warrick Lawson, MD      . acetaminophen (TYLENOL) tablet 650 mg  650 mg Oral Q6H PRN Nira ConnBerry, Jason A, NP      . albuterol (PROVENTIL) (2.5 MG/3ML) 0.083% nebulizer solution 2.5 mg  2.5 mg Nebulization Q4H PRN Antonieta Pertlary, Naiomi Musto Lawson, MD   2.5 mg at 10/06/17 1144  . alum & mag hydroxide-simeth (MAALOX/MYLANTA) 200-200-20 MG/5ML suspension 30 mL  30 mL Oral Q4H PRN Nira ConnBerry, Jason A, NP      . amoxicillin-clavulanate (AUGMENTIN XR) 1000-62.5 MG per 12 hr tablet 2 tablet  2 tablet Oral Q12H Nira ConnBerry, Jason A, NP   2 tablet at 10/07/17 0841  . azithromycin (ZITHROMAX) tablet 250 mg  250 mg Oral Daily Nira ConnBerry, Jason A, NP   250 mg at 10/06/17  1818  . busPIRone (BUSPAR) tablet 5 mg  5 mg Oral TID Antonieta Pertlary, Floyde Dingley Lawson, MD   5 mg at 10/07/17 0841  . cloNIDine (CATAPRES) tablet 0.1 mg  0.1 mg Oral TID PRN Antonieta Pertlary, Tonica Brasington Lawson, MD      . escitalopram (LEXAPRO) tablet 20 mg  20 mg Oral Daily Antonieta Pertlary, Manu Rubey Lawson, MD   20 mg at 10/07/17 0841  . hydrochlorothiazide (HYDRODIURIL) tablet 25 mg  25 mg Oral Daily Nira ConnBerry, Jason A, NP   25 mg at 10/07/17 0841  . lisinopril (PRINIVIL,ZESTRIL) tablet 20 mg  20 mg Oral Daily Antonieta Pertlary, Sally-Ann Cutbirth Lawson, MD   20 mg at 10/07/17 0841  . magnesium hydroxide (MILK OF MAGNESIA) suspension 30 mL  30 mL Oral Daily PRN Nira ConnBerry, Jason A, NP      . nicotine (NICODERM CQ - dosed in mg/24 hours) patch 21 mg  21 mg Transdermal Daily Antonieta Pertlary, Kyler Germer Lawson, MD   21 mg at 10/07/17 0842  . thiamine (VITAMIN B-1) tablet 100 mg  100 mg Oral Daily Nira ConnBerry, Jason A, NP   100 mg at 10/07/17 0841  . traZODone (DESYREL) tablet 150 mg  150 mg Oral QHS,MR X 1 Jasie Meleski, Marlane MingleGreg Lawson, MD        Lab Results: No results found for this or any previous visit (from the past 48 hour(s)).  Blood Alcohol level:  Lab Results  Component Value Date   ETH <10 10/03/2017    Metabolic Disorder Labs: Lab Results  Component Value Date   HGBA1C 5.7 (H) 10/04/2017   MPG 116.89 10/04/2017   No results found for: PROLACTIN Lab Results  Component Value Date   CHOL 221 (H) 10/04/2017   TRIG 125 10/04/2017   HDL 57 10/04/2017   CHOLHDL 3.9 10/04/2017   VLDL 25 10/04/2017   LDLCALC 139 (H) 10/04/2017    Physical Findings: AIMS: Facial and Oral Movements Muscles of Facial Expression: None, normal Lips and Perioral Area: None, normal Jaw: None, normal Tongue: None, normal,Extremity Movements Upper (arms, wrists, hands, fingers): None, normal Lower (legs, knees, ankles, toes): None, normal, Trunk Movements Neck, shoulders, hips: None, normal, Overall Severity Severity of abnormal movements (highest score from questions above): None, normal Incapacitation  due to abnormal movements: None, normal Patient's awareness of abnormal movements (rate only patient's report): No Awareness, Dental Status Current problems with teeth and/or dentures?: No Does patient usually wear dentures?: No  CIWA:  CIWA-Ar Total: 1 COWS:     Musculoskeletal: Strength & Muscle Tone: within normal limits Gait & Station: normal Patient leans: N/A  Psychiatric Specialty  Exam: Physical Exam  Nursing note and vitals reviewed. Constitutional: He is oriented to person, place, and time. He appears well-developed and well-nourished.  HENT:  Head: Normocephalic and atraumatic.  Respiratory: Effort normal.  Neurological: He is alert and oriented to person, place, and time.    ROS  Blood pressure (!) 155/89, pulse 73, temperature 97.8 F (36.6 C), temperature source Oral, resp. rate 20, height 6' (1.829 m), weight 98 kg (216 lb).Body mass index is 29.29 kg/m.  General Appearance: Casual  Eye Contact:  Fair  Speech:  Normal Rate  Volume:  Normal  Mood:  Anxious  Affect:  Congruent  Thought Process:  Coherent  Orientation:  Full (Time, Place, and Person)  Thought Content:  Logical  Suicidal Thoughts:  No  Homicidal Thoughts:  No  Memory:  Immediate;   Fair Recent;   Fair Remote;   Fair  Judgement:  Intact  Insight:  Fair  Psychomotor Activity:  Normal  Concentration:  Concentration: Fair and Attention Span: Fair  Recall:  [employer] of Knowledge:  Fair  Language:  Fair  Akathisia:  Negative  Handed:  Right  AIMS (if indicated):     Assets:  Communication Skills Desire for Improvement Resilience  ADL's:  Intact  Cognition:  WNL  Sleep:  Number of Hours: 6.75     Treatment Plan Summary: Daily contact with patient to assess and evaluate symptoms and progress in treatment, Medication management and Plan Patient is seen and examined.  Patient is a 62 year old male with the above-stated past psychiatric history seen in follow-up.  He continues to slowly  improve with regard to alcohol withdrawal.  He has basically been stopped on all the benzodiazepines.  He requested to be placed on acamprosate today, and we discussed the side effects.  We will start 333 mg p.o. 3 times daily and titrate during the course of hospitalization.  He also stated he was not sleeping well because of his roommate snoring, and I am can increase his trazodone to 150 mg p.o. nightly.  His blood pressure continues to be elevated, and I had ordered a chemistry panel for this morning.  It was not done.  I did like to either increase the lisinopril or change his medication, but I do not know what his chemistries are doing with the ACE inhibitor.  We will continue the clonidine at 0.1 mg p.o. 3 times daily as needed systolic blood pressure greater than 160.  He does need a follow-up chest x-ray for his pneumonia, and we will order that.  Otherwise no other changes in his medicines.  Antonieta Pert, MD 10/07/2017, 11:04 AM

## 2017-10-07 NOTE — Progress Notes (Signed)
Pt presents with a flat affect and depressed mood. Pt reports ongoing depression and SI without intent or a plan. Pt expressed that he can't say that if he wasn't here, that he wouldn't act out on those thoughts. Pt expressed that he always have suicidal thoughts daily. Pt expressed that he's safe here in the hosp and is working on finding him a place to go once he's discharged. Pt denies any withdrawal symptoms.  Orders reviewed with pt.  Verbal support provided. Pt encouraged to attend groups. 15 minute checks performed for safety. X-ray scheduled for 1330.   Pt compliant with tx team.

## 2017-10-07 NOTE — Progress Notes (Signed)
Pt attended group this evening. 

## 2017-10-07 NOTE — Progress Notes (Signed)
Adult Psychoeducational Group Note  Date:  10/07/2017 Time:  11:29 AM  Group Topic/Focus:  Goals Group:   The focus of this group is to help patients establish daily goals to achieve during treatment and discuss how the patient can incorporate goal setting into their daily lives to aide in recovery.  Participation Level:  Active  Participation Quality:  Appropriate  Affect:  Appropriate  Cognitive:  Appropriate  Insight: Good  Engagement in Group:  Engaged  Modes of Intervention:  Discussion  Additional Comments:  Pt was discouraged in group this morning, because of his situation.  Graceann CongressWollie, Vara Mairena Celcia 10/07/2017, 11:29 AM

## 2017-10-07 NOTE — [facility] Group Notes (Signed)
10/07/2017 10am  Type of Therapy and Topic:  Group Therapy:  Identity and Relationships Participation Level:  Active  Description of Group: Using the 'Ungame' patients were guided to express themselves about a variety of topics. Selected cards for this game included identity and relationships. Patients were able to discuss dealing with positive and negative situations, identifying supports, and other ways to understand your identity. Patients shared unique viewpoints but often had similar characteristics.  Patients encouraged to use this dialogue to develop goals and supports for future progress. Therapeutic Goals: 1. Patient will discuss 2 positive and 2 negative situations in their life prior to admission 2. Patient will identify 2 positive support persons in their home environment 3. Patient will explore setting goals for themselves and identify supports they need to achieve these goals 4. Patient will demonstrate empathy for others in the group by responding with positive affirmations of group members shares. Summary of Patient Progress: Actively and appropriately engaged in the group. Patient was able to provide support and validation to other group members.Patient practiced active listening when interacting with the facilitator and other group members. David MaduroRobert spoke about how his children has played a major role in building his identify. He says that for him it has created routine and structure. He reports that he has pets at home that serve as his support system. Patient is still in the process of obtaining treatment goals.     Therapeutic Modalities: Motivational Interviewing Cognitive Behavioral Therapy  David ShearsCassandra  David Braun, LCSW 10/07/2017 11:51 AM

## 2017-10-07 NOTE — Progress Notes (Signed)
Adult Psychoeducational Group Note  Date:  10/07/2017 Time:  1400 Group Topic/Focus:  Nurse Psychoeducational group:   The purpose of this group is to help patients identify strategies for coping with mental health crisis.  Group discusses possible causes of crisis and ways to manage them effectively.  Participation Level:  Active  Participation Quality:  Appropriate  Affect:  Appropriate  Cognitive:  Appropriate  Insight: Appropriate  Engagement in Group:  Engaged  Modes of Intervention:  Discussion and Education  Additional Comments:     

## 2017-10-07 NOTE — Progress Notes (Signed)
D    Pt has been visible on the milieu this evening   He interacts appropriately with staff and peers    He is pleasant on approach and cooperative   He endorses depression and anxiety but reports improvement A    Verbal support given    Medications administered and effectiveness monitored    Q 15 min checks R    Pt remains safe and contracts for safety 

## 2017-10-08 DIAGNOSIS — R45851 Suicidal ideations: Secondary | ICD-10-CM

## 2017-10-08 DIAGNOSIS — N2889 Other specified disorders of kidney and ureter: Secondary | ICD-10-CM

## 2017-10-08 MED ORDER — BUSPIRONE HCL 10 MG PO TABS
10.0000 mg | ORAL_TABLET | Freq: Three times a day (TID) | ORAL | Status: DC
  Administered 2017-10-08 – 2017-10-12 (×13): 10 mg via ORAL
  Filled 2017-10-08 (×17): qty 1

## 2017-10-08 MED ORDER — LOSARTAN POTASSIUM 50 MG PO TABS
100.0000 mg | ORAL_TABLET | Freq: Every day | ORAL | Status: DC
  Administered 2017-10-09 – 2017-10-12 (×4): 100 mg via ORAL
  Filled 2017-10-08 (×7): qty 2

## 2017-10-08 MED ORDER — LOSARTAN POTASSIUM 50 MG PO TABS
50.0000 mg | ORAL_TABLET | Freq: Once | ORAL | Status: AC
  Administered 2017-10-08: 50 mg via ORAL
  Filled 2017-10-08 (×2): qty 1

## 2017-10-08 NOTE — Progress Notes (Signed)
Recreation Therapy Notes  Date:  6.10.19  Time: 0930 Location: [address] Dayroom  Group Topic: Stress Management  Goal Area(s) Addresses:  Patient will verbalize importance of using healthy stress management.  Patient will identify positive emotions associated with healthy stress management.   Intervention: Stress Management  Activity :  Guided Imagery.  LRT introduced patients to the stress management technique of guided imagery.  LRT read a script that allowed patients to take a mental vacation to the beach.  Patients were to follow along as script was read to engage in the activity.  Education:  Stress Management, Discharge Planning.   Education Outcome: Acknowledges edcuation/In group clarification offered/Needs additional education  Clinical Observations/Feedback: Pt did not attend group.     Ryen Heitmeyer, LRT/CTRS         Markale Birdsell A 10/08/2017 11:29 AM 

## 2017-10-08 NOTE — [facility] Group Notes (Signed)
[facility] LCSW Group Therapy Note  Date/Time: 10/08/17, 1315  Type of Therapy and Topic:  Group Therapy:  Overcoming Obstacles  Participation Level:  active  Description of Group:    In this group patients will be encouraged to explore what they see as obstacles to their own wellness and recovery. They will be guided to discuss their thoughts, feelings, and behaviors related to these obstacles. The group will process together ways to cope with barriers, with attention given to specific choices patients can make. Each patient will be challenged to identify changes they are motivated to make in order to overcome their obstacles. This group will be process-oriented, with patients participating in exploration of their own experiences as well as giving and receiving support and challenge from other group members.  Therapeutic Goals: 1. Patient will identify personal and current obstacles as they relate to admission. 2. Patient will identify barriers that currently interfere with their wellness or overcoming obstacles.  3. Patient will identify feelings, thought process and behaviors related to these barriers. 4. Patient will identify two changes they are willing to make to overcome these obstacles:    Summary of Patient Progress: Pt identified hopelessness and family conflict as obstacles in his life.  Pt was active in group discussion regarding steps he could take to overcome obstacles in his life.       Therapeutic Modalities:   Cognitive Behavioral Therapy Solution Focused Therapy Motivational Interviewing Relapse Prevention Therapy  Daleen SquibbGreg Amaziah Raisanen, LCSW

## 2017-10-08 NOTE — [facility] Group Notes (Signed)
[facility] Group Notes:  (Nursing/MHT/Case Management/Adjunct)  Date:  10/08/2017  Time:  4:54 PM  Type of Therapy:  Psychoeducational Skills  Participation Level:  Active  Participation Quality:  Appropriate and Resistant  Affect:  Appropriate  Cognitive:  Alert and Oriented  Insight:  Improving  Engagement in Group:  Improving  Modes of Intervention:  Activity, Discussion and Education  Summary of Progress/Problems: Pt was an active participant in group discussion. Pt was able to identify one coping skill for self care to work on post discharge.   Brett AlbinoLandis, Romon Devereux E 10/08/2017, 4:54 PM

## 2017-10-08 NOTE — Progress Notes (Signed)
The patient expressed in group that he had a better day since he was able to find [facility] more [facility]. His goal for tomorrow is to work on his discharge plans with [facility] and have a better day than today.

## 2017-10-08 NOTE — Progress Notes (Signed)
Patient ID: David LiasRobert Small, male   DOB: January 03, 1956, 62 y.o.   MRN: [id number]  Pt currently presents with a flat affect and cooperative behavior. Reports ongoing depression and negative self thoughts. Pt states goal is to "feel less depressed." He intends to do so by "being active."  Pt provided with medications per providers orders. Pt's labs and vitals were monitored throughout the night. Pt given a 1:1 about emotional and mental status. Pt supported and encouraged to express concerns and questions. Pt educated on medications and assertiveness techniques.   Pt's safety ensured with 15 minute and environmental checks. Pt endorses passive SI, denies any current plan. Pt currently denies HI and A/V hallucinations. Pt verbally agrees to seek staff if SI worsens, HI or A/VH occurs and to consult with staff before acting on any harmful thoughts. Will continue POC.

## 2017-10-08 NOTE — Progress Notes (Signed)
[facility] MD Progress Note  10/08/2017 1:18 PM David Small  MRN:  [id number] Subjective: Patient is seen and examined.  Patient is a 62 year old male with a past psychiatric history significant for major depression as well as alcohol use disorder.  He also suffers from hypertension as well as pneumonia.  He is seen in follow-up.  He continues to ruminate about his suicidal thoughts.  He stated he has been suicidal for "forever".  He continues to also be depressed.  He is very anxious about the fact that he has essentially no living quarters right now.  He stated he has a friend's place to go to, but he seems to be perturbed about all of that.  We repeated a chest x-ray yesterday, and it seems that it is clearing up.  He continues on Augmentin and azithromycin.  His blood pressure is a bit better today at 147/97.  He continues on clonidine 0.1 mg p.o. 3 times daily as needed systolic blood pressure greater than 160, hydrochlorothiazide 25 mg p.o. daily and lisinopril 20 mg p.o. daily.  I had written for repeat chemistries, and those cells still not been done.  I am going to order those for tomorrow morning (again). Principal Problem: Severe recurrent major depression without psychotic features (HCC) Diagnosis:   Patient Active Problem List   Diagnosis Date Noted  . 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 Essential hypertension [I10]   . Severe recurrent major depression without psychotic features (HCC) [F33.2] 10/03/2017   Total Time spent with patient: 20 minutes  Past Psychiatric History: See admission H&P  Past Medical History:  Past Medical History:  Diagnosis Date  . Depression   . Hypertension    History reviewed. No pertinent surgical history. Family History: History reviewed. No pertinent family history. Family Psychiatric  History: See admission H&P Social History:  Social History   Substance and Sexual Activity  Alcohol Use Yes     Social History    Substance and Sexual Activity  Drug Use Not Currently    Social History   Socioeconomic History  . Marital status: Single    Spouse name: Not on file  . Number of children: Not on file  . Years of education: Not on file  . Highest education level: Not on file  Occupational History  . Not on file  Social Needs  . Financial resource strain: Not on file  . Food insecurity:    Worry: Not on file    Inability: Not on file  . Transportation needs:    Medical: Not on file    Non-medical: Not on file  Tobacco Use  . Smoking status: Current Every Day Smoker    Types: Cigarettes  . Smokeless tobacco: Never Used  Substance and Sexual Activity  . Alcohol use: Yes  . Drug use: Not Currently  . Sexual activity: Not Currently  Lifestyle  . Physical activity:    Days per week: Not on file    Minutes per session: Not on file  . Stress: Not on file  Relationships  . Social connections:    Talks on phone: Not on file    Gets together: Not on file    Attends religious service: Not on file    Active member of club or organization: Not on file    Attends meetings of clubs or organizations: Not on file    Relationship status: Not on file  Other Topics Concern  . Not on file  Social  History Narrative  . Not on file   Additional Social History:                         Sleep: Fair  Appetite:  Fair  Current Medications: Current Facility-Administered Medications  Medication Dose Route Frequency Provider Last Rate Last Dose  . acamprosate (CAMPRAL) tablet 333 mg  333 mg Oral TID Antonieta Pert, MD   333 mg at 10/08/17 0813  . acetaminophen (TYLENOL) tablet 650 mg  650 mg Oral Q6H PRN Nira Conn A, NP   650 mg at 10/08/17 1120  . albuterol (PROVENTIL) (2.5 MG/3ML) 0.083% nebulizer solution 2.5 mg  2.5 mg Nebulization Q4H PRN Antonieta Pert, MD   2.5 mg at 10/06/17 1144  . alum & mag hydroxide-simeth (MAALOX/MYLANTA) 200-200-20 MG/5ML suspension 30 mL  30 mL Oral  Q4H PRN Nira Conn A, NP      . amoxicillin-clavulanate (AUGMENTIN XR) 1000-62.5 MG per 12 hr tablet 2 tablet  2 tablet Oral Q12H Nira Conn A, NP   2 tablet at 10/08/17 [id number]  . busPIRone (BUSPAR) tablet 5 mg  5 mg Oral TID Antonieta Pert, MD   5 mg at 10/08/17 1154  . cloNIDine (CATAPRES) tablet 0.1 mg  0.1 mg Oral TID PRN Antonieta Pert, MD      . escitalopram (LEXAPRO) tablet 20 mg  20 mg Oral Daily Antonieta Pert, MD   20 mg at 10/08/17 9604  . hydrochlorothiazide (HYDRODIURIL) tablet 25 mg  25 mg Oral Daily Nira Conn A, NP   25 mg at 10/08/17 5409  . lisinopril (PRINIVIL,ZESTRIL) tablet 20 mg  20 mg Oral Daily Antonieta Pert, MD   20 mg at 10/08/17 8119  . magnesium hydroxide (MILK OF MAGNESIA) suspension 30 mL  30 mL Oral Daily PRN Nira Conn A, NP      . nicotine (NICODERM CQ - dosed in mg/24 hours) patch 21 mg  21 mg Transdermal Daily Antonieta Pert, MD   21 mg at 10/08/17 1478  . thiamine (VITAMIN B-1) tablet 100 mg  100 mg Oral Daily Nira Conn A, NP   100 mg at 10/08/17 0813  . traZODone (DESYREL) tablet 150 mg  150 mg Oral QHS,MR X 1 Antonieta Pert, MD   150 mg at 10/07/17 2128    Lab Results: No results found for this or any previous visit (from the past 48 hour(s)).  Blood Alcohol level:  Lab Results  Component Value Date   ETH <10 10/03/2017    Metabolic Disorder Labs: Lab Results  Component Value Date   HGBA1C 5.7 (H) 10/04/2017   MPG 116.89 10/04/2017   No results found for: PROLACTIN Lab Results  Component Value Date   CHOL 221 (H) 10/04/2017   TRIG 125 10/04/2017   HDL 57 10/04/2017   CHOLHDL 3.9 10/04/2017   VLDL 25 10/04/2017   LDLCALC 139 (H) 10/04/2017    Physical Findings: AIMS: Facial and Oral Movements Muscles of Facial Expression: None, normal Lips and Perioral Area: None, normal Jaw: None, normal Tongue: None, normal,Extremity Movements Upper (arms, wrists, hands, fingers): None, normal Lower (legs, knees,  ankles, toes): None, normal, Trunk Movements Neck, shoulders, hips: None, normal, Overall Severity Severity of abnormal movements (highest score from questions above): None, normal Incapacitation due to abnormal movements: None, normal Patient's awareness of abnormal movements (rate only patient's report): No Awareness, Dental Status Current problems with teeth and/or dentures?: No Does patient  usually wear dentures?: No  CIWA:  CIWA-Ar Total: 4 COWS:     Musculoskeletal: Strength & Muscle Tone: within normal limits Gait & Station: normal Patient leans: N/A  Psychiatric Specialty Exam: Physical Exam  Nursing note and vitals reviewed. Constitutional: He is oriented to person, place, and time. He appears well-developed and well-nourished.  HENT:  Head: Normocephalic and atraumatic.  Respiratory: Effort normal.  Neurological: He is alert and oriented to person, place, and time.    ROS  Blood pressure (!) 147/97, pulse 78, temperature 98.2 F (36.8 C), temperature source Oral, resp. rate 18, height 6' (1.829 m), weight 98 kg (216 lb).Body mass index is 29.29 kg/m.  General Appearance: Casual  Eye Contact:  Fair  Speech:  Normal Rate  Volume:  Normal  Mood:  Depressed  Affect:  Congruent  Thought Process:  Coherent  Orientation:  Full (Time, Place, and Person)  Thought Content:  Logical  Suicidal Thoughts:  Yes.  without intent/plan  Homicidal Thoughts:  No  Memory:  Immediate;   Fair Recent;   Fair Remote;   Fair  Judgement:  Intact  Insight:  Lacking  Psychomotor Activity:  Normal  Concentration:  Concentration: Fair and Attention Span: Fair  Recall:  F[employer]ervFair  Fund of Knowledge:  Fair  Language:  Fair  Akathisia:  Negative  Handed:  Right  AIMS (if indicated):     Assets:  Desire for Improvement Resilience  ADL's:  Intact  Cognition:  WNL  Sleep:  Number of Hours: 6.5     Treatment Plan Summary: Daily contact with patient to assess and evaluate symptoms and  progress in treatment, Medication management and Plan Patient is seen and examined.  Patient is a 62 year old male with the above-stated past psychiatric history is seen in follow-up.  His Lexapro was just increased on 10/07/2017, so really cannot do anything with that at this point.  He was started on acamprosate 333 mg p.o. 3 times daily yesterday.  I will go on and increase his BuSpar to 10 mg p.o. 3 times daily today as well.  His blood pressure remains elevated, and I was trying to wait until I got electrolyte panel back given his renal insufficiency.  I am going to go on and stop the lisinopril, and place him on losartan 100 mg p.o. daily.  We will reorder the electrolyte panel.  We will continue the clonidine for elevated systolic blood pressures.  His chest x-ray yesterday shows his pneumonia clearing, and he continues on azithromycin and Augmentin at this point.  Antonieta PertGreg Lawson Clary, MD 10/08/2017, 1:18 PM

## 2017-10-09 DIAGNOSIS — Z590 Homelessness: Secondary | ICD-10-CM

## 2017-10-09 DIAGNOSIS — F1020 Alcohol dependence, uncomplicated: Secondary | ICD-10-CM

## 2017-10-09 DIAGNOSIS — G4700 Insomnia, unspecified: Secondary | ICD-10-CM

## 2017-10-09 DIAGNOSIS — Z638 Other specified problems related to primary support group: Secondary | ICD-10-CM

## 2017-10-09 LAB — COMPREHENSIVE METABOLIC PANEL
ALBUMIN: 4.1 g/dL (ref 3.5–5.0)
ALK PHOS: 34 U/L — AB (ref 38–126)
ALT: 22 U/L (ref 17–63)
ANION GAP: 6 (ref 5–15)
AST: 18 U/L (ref 15–41)
BUN: 22 mg/dL — AB (ref 6–20)
CO2: 32 mmol/L (ref 22–32)
Calcium: 9.3 mg/dL (ref 8.9–10.3)
Chloride: 103 mmol/L (ref 101–111)
Creatinine, Ser: 1.07 mg/dL (ref 0.61–1.24)
GFR calc Af Amer: >60 mL/min (ref >60)
GFR calc non Af Amer: >60 mL/min (ref >60)
GLUCOSE: 102 mg/dL — AB (ref 65–99)
POTASSIUM: 4.6 mmol/L (ref 3.5–5.1)
SODIUM: 141 mmol/L (ref 135–145)
TOTAL PROTEIN: 7 g/dL (ref 6.5–8.1)
Total Bilirubin: 0.4 mg/dL (ref 0.3–1.2)

## 2017-10-09 MED ORDER — TRAZODONE HCL 150 MG PO TABS
150.0000 mg | ORAL_TABLET | Freq: Every evening | ORAL | Status: DC | PRN
  Administered 2017-10-09 – 2017-10-11 (×3): 150 mg via ORAL
  Filled 2017-10-09 (×2): qty 1

## 2017-10-09 NOTE — Progress Notes (Addendum)
[facility] MD Progress Note  10/09/2017 2:52 PM David Small  MRN:  [id number] Subjective:  Patient reports he is feeling " a little bit better today". Reports he is becoming more optimistic, and in particular states that he is excited about [facility] referral . Today denies suicidal ideations. Denies medication side effects. Objective : I have discussed case with treatment team and have met with patient . 62 year old male, lives alone , history of depression and alcohol use disorder. Presented due to worsening depression and suicidal ideations. Reports he remains depressed, but as above, endorses some improvement compared to admission and is more future oriented, expressing interest in going to [facility] at discharge. States " I need something like it, because I have little support, and I know I will be back at square one if I go back out". No current alcohol WDL symptoms- no tremors, no diaphoresis, no restlessness, BP has tended to improve, no tachycardia. Denies medication side effects. Has been visible on unit, behavior calm and in good control. Denies SI today. Labs reviewed as below. Principal Problem: Severe recurrent major depression without psychotic features ([place]) Diagnosis:   Patient Active Problem List   Diagnosis Date Noted  . Alcohol dependence with alcohol-induced mood disorder ([place]) [F10.24]   . Essential hypertension [I10]   . Severe recurrent major depression without psychotic features ([place]) [F33.2] 10/03/2017   Total Time spent with patient: 20 minutes  Past Psychiatric History:   Past Medical History:  Past Medical History:  Diagnosis Date  . Depression   . Hypertension    History reviewed. No pertinent surgical history. Family History: History reviewed. No pertinent family history. Family Psychiatric  History:  Social History:  Social History   Substance and Sexual Activity  Alcohol Use Yes     Social History   Substance and Sexual Activity  Drug Use Not Currently     Social History   Socioeconomic History  . Marital status: Single    Spouse name: Not on file  . Number of children: Not on file  . Years of education: Not on file  . Highest education level: Not on file  Occupational History  . Not on file  Social Needs  . Financial resource strain: Not on file  . Food insecurity:    Worry: Not on file    Inability: Not on file  . Transportation needs:    Medical: Not on file    Non-medical: Not on file  Tobacco Use  . Smoking status: Current Every Day Smoker    Types: Cigarettes  . Smokeless tobacco: Never Used  Substance and Sexual Activity  . Alcohol use: Yes  . Drug use: Not Currently  . Sexual activity: Not Currently  Lifestyle  . Physical activity:    Days per week: Not on file    Minutes per session: Not on file  . Stress: Not on file  Relationships  . Social connections:    Talks on phone: Not on file    Gets together: Not on file    Attends religious service: Not on file    Active member of club or organization: Not on file    Attends meetings of clubs or organizations: Not on file    Relationship status: Not on file  Other Topics Concern  . Not on file  Social History Narrative  . Not on file   Additional Social History:   Sleep: improving   Appetite:  improving   Current Medications: Current Facility-Administered Medications  Medication Dose Route Frequency Provider Last Rate Last Dose  . acamprosate (CAMPRAL) tablet 333 mg  333 mg Oral TID Sharma Covert, MD   333 mg at 10/08/17 0813  . acetaminophen (TYLENOL) tablet 650 mg  650 mg Oral Q6H PRN Lindon Romp A, NP   650 mg at 10/08/17 1120  . albuterol (PROVENTIL) (2.5 MG/3ML) 0.083% nebulizer solution 2.5 mg  2.5 mg Nebulization Q4H PRN Sharma Covert, MD   2.5 mg at 10/06/17 1144  . alum & mag hydroxide-simeth (MAALOX/MYLANTA) 200-200-20 MG/5ML suspension 30 mL  30 mL Oral Q4H PRN Lindon Romp A, NP      . amoxicillin-clavulanate (AUGMENTIN XR) 1000-62.5  MG per 12 hr tablet 2 tablet  2 tablet Oral Q12H Lindon Romp A, NP   2 tablet at 10/09/17 0746  . busPIRone (BUSPAR) tablet 10 mg  10 mg Oral TID Sharma Covert, MD   10 mg at 10/09/17 1202  . cloNIDine (CATAPRES) tablet 0.1 mg  0.1 mg Oral TID PRN Sharma Covert, MD      . escitalopram (LEXAPRO) tablet 20 mg  20 mg Oral Daily Sharma Covert, MD   20 mg at 10/09/17 0746  . hydrochlorothiazide (HYDRODIURIL) tablet 25 mg  25 mg Oral Daily Lindon Romp A, NP   25 mg at 10/09/17 0746  . losartan (COZAAR) tablet 100 mg  100 mg Oral Daily Sharma Covert, MD   100 mg at 10/09/17 0746  . magnesium hydroxide (MILK OF MAGNESIA) suspension 30 mL  30 mL Oral Daily PRN Lindon Romp A, NP      . nicotine (NICODERM CQ - dosed in mg/24 hours) patch 21 mg  21 mg Transdermal Daily Sharma Covert, MD   21 mg at 10/09/17 0755  . thiamine (VITAMIN B-1) tablet 100 mg  100 mg Oral Daily Lindon Romp A, NP   100 mg at 10/09/17 0746  . traZODone (DESYREL) tablet 150 mg  150 mg Oral QHS,MR X 1 Sharma Covert, MD   150 mg at 10/08/17 2115    Lab Results:  Results for orders placed or performed during the hospital encounter of 10/03/17 (from the past 48 hour(s))  Comprehensive metabolic panel     Status: Abnormal   Collection Time: 10/09/17  6:25 AM  Result Value Ref Range   Sodium 141 135 - 145 mmol/L   Potassium 4.6 3.5 - 5.1 mmol/L   Chloride 103 101 - 111 mmol/L   CO2 32 22 - 32 mmol/L   Glucose, Bld 102 (H) 65 - 99 mg/dL   BUN 22 (H) 6 - 20 mg/dL   Creatinine, Ser 1.07 0.61 - 1.24 mg/dL   Calcium 9.3 8.9 - 10.3 mg/dL   Total Protein 7.0 6.5 - 8.1 g/dL   Albumin 4.1 3.5 - 5.0 g/dL   AST 18 15 - 41 U/L   ALT 22 17 - 63 U/L   Alkaline Phosphatase 34 (L) 38 - 126 U/L   Total Bilirubin 0.4 0.3 - 1.2 mg/dL   GFR calc non Af Amer >60 >60 mL/min   GFR calc Af Amer >60 >60 mL/min    Comment: (NOTE) The eGFR has been calculated using the CKD EPI equation. This calculation has not been  validated in all clinical situations. eGFR's persistently <60 mL/min signify possible Chronic Kidney Disease.    Anion gap 6 5 - 15    Comment: Performed at [facility], [address] [place], [place]  [address]    Blood Alcohol level:  Lab Results  Component Value Date   ETH <10 35/59/7416    Metabolic Disorder Labs: Lab Results  Component Value Date   HGBA1C 5.7 (H) 10/04/2017   MPG 116.89 10/04/2017   No results found for: PROLACTIN Lab Results  Component Value Date   CHOL 221 (H) 10/04/2017   TRIG 125 10/04/2017   HDL 57 10/04/2017   CHOLHDL 3.9 10/04/2017   VLDL 25 10/04/2017   LDLCALC 139 (H) 10/04/2017    Physical Findings: AIMS: Facial and Oral Movements Muscles of Facial Expression: None, normal Lips and Perioral Area: None, normal Jaw: None, normal Tongue: None, normal,Extremity Movements Upper (arms, wrists, hands, fingers): None, normal Lower (legs, knees, ankles, toes): None, normal, Trunk Movements Neck, shoulders, hips: None, normal, Overall Severity Severity of abnormal movements (highest score from questions above): None, normal Incapacitation due to abnormal movements: None, normal Patient's awareness of abnormal movements (rate only patient's report): No Awareness, Dental Status Current problems with teeth and/or dentures?: No Does patient usually wear dentures?: No  CIWA:  CIWA-Ar Total: 3 COWS:     Musculoskeletal: Strength & Muscle Tone: within normal limits Gait & Station: normal Patient leans: N/A  Psychiatric Specialty Exam: Physical Exam  ROS denies headache, no chest pain, no shortness of breath at room air, no vomiting , no fever or chills .  Blood pressure (!) 143/92, pulse 77, temperature 98 F (36.7 C), temperature source Oral, resp. rate 18, height 6' (1.829 m), weight 98 kg (216 lb).Body mass index is 29.29 kg/m.  General Appearance: fairly groomed   Eye Contact:  Good  Speech:  Normal Rate  Volume:   Normal  Mood:  reports partially improved mood   Affect:  constricted, anxious   Thought Process:  Linear and Descriptions of Associations: Intact  Orientation:  Full (Time, Place, and Person)  Thought Content:  denies hallucinations, no delusions, not internally preoccupied   Suicidal Thoughts:  No denies suicidal or self injurious ideations at this time, contracts for safety on unit , denies homicidal or violent ideations   Homicidal Thoughts:  No  Memory:  recent and remote grossly intact   Judgement:  Other:  improving   Insight:  Fair and improving   Psychomotor Activity:  Normal  Concentration:  Concentration: Good and Attention Span: Good  Recall:  Good  Fund of Knowledge:  Good  Language:  Good  Akathisia:  Negative  Handed:  Right  AIMS (if indicated):     Assets:  Desire for Improvement Resilience  ADL's:  Intact  Cognition:  WNL  Sleep:  Number of Hours: 6.75   Assessment - 62 year old male, history of depression and alcohol use disorder, presented with worsening depression and SI. Reports significant psychosocial stressors, including homelessness and limited support network. Reports partially improved mood, improved sense of hope/less hopelessness and currently denies SI. Denies medication side effects. Interested in going to [facility] .   Treatment Plan Summary: Daily contact with patient to assess and evaluate symptoms and progress in treatment, Medication management, Plan inpatient treatment  and medications as below  Encourage group and milieu participation to work on coping skills and symptom reduction Encourage efforts to work on sobriety and relapse prevention  Treatment team working on disposition planning - see above  Continue Campral 666 mgrs TID for alcohol dependence Continue Buspar 10 mgrs TID for anxiety Continue Lexapro 20 mgrs QDAY for depression, anxiety Continue Cozaar / HCTZ for HTN Continue Trazodone  150 mgrs QHS PRN for insomnia  Jenne Campus,  MD 10/09/2017, 2:52 PM

## 2017-10-09 NOTE — Progress Notes (Signed)
D    Pt has been visible on the milieu this evening   He interacts appropriately with staff and peers    He is pleasant on approach and cooperative   He endorses depression and anxiety but reports improvement  He said the camprall made him sick today and he doesn't want it  A    Verbal support given    Medications administered and effectiveness monitored    Q 15 min checks R    Pt remains safe and contracts for safety

## 2017-10-09 NOTE — Plan of Care (Signed)
  Problem: Education: Goal: Ability to make informed decisions regarding treatment will improve Outcome: Progressing   Problem: Safety: Goal: Periods of time without injury will increase Outcome: Progressing

## 2017-10-09 NOTE — Progress Notes (Signed)
D: Pt was in dayroom upon initial approach.  He reports his day has "been okay."  His goal is to "try to feel better everyday."  Pt reports he feels better today than he did yesterday.  He states he is "trying to get into [facility]" and [facility] two.  He reports feeling safe with this plan.  Denies SI/HI, hallucinations, withdrawal symptoms, and pain.  Pt has been visible in milieu interacting appropriately with peers and staff.  A: Introduced self to pt.  Actively listened to pt and provided support and encouragement.  Medication administered per order.  PRN medication administered for sleep.  Q15 minute safety checks maintained.  R: Pt is compliant with medications.  He verbally contracts for safety and reports he will inform staff of needs and concerns.  Will continue to monitor and assess.

## 2017-10-09 NOTE — Progress Notes (Signed)
Patient ID: David LiasRobert Small, male   DOB: [birth year], 62 y.o.   MRN: [id number]   D: Patient continues to have passive SI but denies HI and AVH. He states he is still depressed but is feeling more hopeful now that [facility]. Affect depressed.  A: Patient given emotional support from RN. Patient given medications per MD orders. Patient encouraged to attend groups and unit activities. Patient encouraged to come to staff with any questions or concerns.  R: Patient remains cooperative and appropriate. Will continue to monitor patient for safety.

## 2017-10-09 NOTE — [facility] Group Notes (Signed)
LCSW Group Therapy Note 10/09/2017 2:22 PM  Type of Therapy/Topic: Group Therapy: Feelings about Diagnosis  Participation Level: Active   Description of Group:  This group will allow patients to explore their thoughts and feelings about diagnoses they have received. Patients will be guided to explore their level of understanding and acceptance of these diagnoses. Facilitator will encourage patients to process their thoughts and feelings about the reactions of others to their diagnosis and will guide patients in identifying ways to discuss their diagnosis with significant others in their lives. This group will be process-oriented, with patients participating in exploration of their own experiences, giving and receiving support, and processing challenge from other group members.  Therapeutic Goals: 1. Patient will demonstrate understanding of diagnosis as evidenced by identifying two or more symptoms of the disorder 2. Patient will be able to express two feelings regarding the diagnosis 3. Patient will demonstrate their ability to communicate their needs through discussion and/or role play  Summary of Patient Progress:  Molly MaduroRobert was engaged and participated throughout the group session. Molly MaduroRobert reports that his emotions are "similar to a roller coaster". Molly MaduroRobert states that he was fearful when he learned he had a mental health diagnosis, however over he years he has grew to accept it.    Therapeutic Modalities:  Cognitive Behavioral Therapy Brief Therapy Feelings Identification    Cozetta Seif Catalina AntiguaWilliams LCSWA Clinical Social Worker

## 2017-10-10 ENCOUNTER — Encounter (HOSPITAL_COMMUNITY): Payer: Self-pay | Admitting: Behavioral Health

## 2017-10-10 MED ORDER — BUSPIRONE HCL 10 MG PO TABS: 10.0000 mg | ORAL_TABLET | Freq: Three times a day (TID) | ORAL | 0 refills | Status: AC

## 2017-10-10 MED ORDER — LOSARTAN POTASSIUM 100 MG PO TABS: 100.0000 mg | ORAL_TABLET | Freq: Every day | ORAL | 0 refills | Status: AC

## 2017-10-10 NOTE — Progress Notes (Signed)
Pt presents with a flat affect and depressed mood. Pt rates on his self inventory sheet: depression 6/10, anxiety 610, and hopelessness 6/10. Pt denies active suicidal thoughts during shift assessment. Pt expressed feeling hopeful that he would get into [facility] today or tomorrow. Pt expressed decreased depression and anxiety today now that he have a safe place to go. Pt refuses to take Campral due to it making him feel weird. MD made aware. Pt hypertensive and Clonidine administered as ordered per MD. Pt b/p decreased after taking Clonidine.   Medications reviewed with pt and education provided. V/s and labs reviewed.ent team completed. Verbal support provided. Self inventory sheet reviewed. Pt encouraged to attend scheduled groups. Abnormal v/s reassessed. 15 minute checks performed for safety.   Pt expressed feeling hopeless after finding out [facility].

## 2017-10-10 NOTE — Progress Notes (Signed)
Pt attend wrap up group. His day was a 7. His goal do better today than yesterday.

## 2017-10-10 NOTE — Progress Notes (Signed)
Recreation Therapy Notes  Date: 6.12.19 Time: 0930 Location: 300 [address] Dayroom  Group Topic: Stress Management  Goal Area(s) Addresses:  Patient will verbalize importance of using healthy stress management.  Patient will identify positive emotions associated with healthy stress management.   Intervention: Stress Management  Activity :  Meditation.  LRT introduced the stress management technique of meditation.  LRT played a meditation on letting go of the past and focusing on right now.  Patiens were to listen and follow along as meditation played.  Education: Stress Management, Discharge Planning.   Education Outcome: Acknowledges edcuation/In group clarification offered/Needs additional education  Clinical Observations/Feedback: Pt did not attend group.     Caroll RancherMarjette Miel Wisener, LRT/CTRS          Caroll RancherLindsay, Rudy Luhmann A 10/10/2017 12:28 PM

## 2017-10-10 NOTE — Tx Team (Signed)
Interdisciplinary Treatment and Diagnostic Plan Update  10/10/2017 Time of Session: 1038 David Small MRN: [id number]  Principal Diagnosis: Severe recurrent major depression without psychotic features [facility])  Secondary Diagnoses: Principal Problem:   Severe recurrent major depression without psychotic features (HCC) Active Problems:   Alcohol dependence with alcohol-induced mood disorder (HCC)   Essential hypertension   Current Medications:  Current Facility-Administered Medications  Medication Dose Route Frequency Provider Last Rate Last Dose  . acamprosate (CAMPRAL) tablet 333 mg  333 mg Oral TID Antonieta Pert, MD   333 mg at 10/08/17 0813  . acetaminophen (TYLENOL) tablet 650 mg  650 mg Oral Q6H PRN Nira Conn A, NP   650 mg at 10/10/17 0815  . albuterol (PROVENTIL) (2.5 MG/3ML) 0.083% nebulizer solution 2.5 mg  2.5 mg Nebulization Q4H PRN Antonieta Pert, MD   2.5 mg at 10/06/17 1144  . alum & mag hydroxide-simeth (MAALOX/MYLANTA) 200-200-20 MG/5ML suspension 30 mL  30 mL Oral Q4H PRN Nira Conn A, NP      . amoxicillin-clavulanate (AUGMENTIN XR) 1000-62.5 MG per 12 hr tablet 2 tablet  2 tablet Oral Q12H Nira Conn A, NP   2 tablet at 10/10/17 0811  . busPIRone (BUSPAR) tablet 10 mg  10 mg Oral TID Antonieta Pert, MD   10 mg at 10/10/17 0811  . cloNIDine (CATAPRES) tablet 0.1 mg  0.1 mg Oral TID PRN Antonieta Pert, MD      . escitalopram (LEXAPRO) tablet 20 mg  20 mg Oral Daily Antonieta Pert, MD   20 mg at 10/10/17 0811  . hydrochlorothiazide (HYDRODIURIL) tablet 25 mg  25 mg Oral Daily Nira Conn A, NP   25 mg at 10/10/17 0811  . losartan (COZAAR) tablet 100 mg  100 mg Oral Daily Antonieta Pert, MD   100 mg at 10/10/17 0811  . magnesium hydroxide (MILK OF MAGNESIA) suspension 30 mL  30 mL Oral Daily PRN Nira Conn A, NP      . nicotine (NICODERM CQ - dosed in mg/24 hours) patch 21 mg  21 mg Transdermal Daily Antonieta Pert, MD   21 mg at  10/10/17 0811  . thiamine (VITAMIN B-1) tablet 100 mg  100 mg Oral Daily Nira Conn A, NP   100 mg at 10/10/17 0811  . traZODone (DESYREL) tablet 150 mg  150 mg Oral QHS PRN Cobos, Rockey Situ, MD   150 mg at 10/09/17 2129   PTA Medications: No medications prior to admission.    Patient Stressors: Financial difficulties Medication change or noncompliance Substance abuse  Patient Strengths: Ability for insight Average or above average intelligence Capable of independent living Wellsite geologist fund of knowledge Motivation for treatment/growth  Treatment Modalities: Medication Management, Group therapy, Case management,  1 to 1 session with clinician, Psychoeducation, Recreational therapy.   Physician Treatment Plan for Primary Diagnosis: Severe recurrent major depression without psychotic features (HCC) Long Term Goal(s): Improvement in symptoms so as ready for discharge Improvement in symptoms so as ready for discharge   Short Term Goals: Ability to identify changes in lifestyle to reduce recurrence of condition will improve Ability to verbalize feelings will improve Ability to disclose and discuss suicidal ideas Ability to demonstrate self-control will improve Ability to maintain clinical measurements within normal limits will improve Compliance with prescribed medications will improve Ability to identify triggers associated with substance abuse/mental health issues will improve  Medication Management: Evaluate patient's response, side effects, and tolerance of medication regimen.  Therapeutic  Interventions: 1 to 1 sessions, Unit Group sessions and Medication administration.  Evaluation of Outcomes: Progressing  Physician Treatment Plan for Secondary Diagnosis: Principal Problem:   Severe recurrent major depression without psychotic features (HCC) Active Problems:   Alcohol dependence with alcohol-induced mood disorder (HCC)   Essential hypertension  Long  Term Goal(s): Improvement in symptoms so as ready for discharge Improvement in symptoms so as ready for discharge   Short Term Goals: Ability to identify changes in lifestyle to reduce recurrence of condition will improve Ability to verbalize feelings will improve Ability to disclose and discuss suicidal ideas Ability to demonstrate self-control will improve Ability to maintain clinical measurements within normal limits will improve Compliance with prescribed medications will improve Ability to identify triggers associated with substance abuse/mental health issues will improve     Medication Management: Evaluate patient's response, side effects, and tolerance of medication regimen.  Therapeutic Interventions: 1 to 1 sessions, Unit Group sessions and Medication administration.  Evaluation of Outcomes: Progressing   RN Treatment Plan for Primary Diagnosis: Severe recurrent major depression without psychotic features (HCC) Long Term Goal(s): Knowledge of disease and therapeutic regimen to maintain health will improve  Short Term Goals: Ability to identify and develop effective coping behaviors will improve and Compliance with prescribed medications will improve  Medication Management: RN will administer medications as ordered by provider, will assess and evaluate patient's response and provide education to patient for prescribed medication. RN will report any adverse and/or side effects to prescribing provider.  Therapeutic Interventions: 1 on 1 counseling sessions, Psychoeducation, Medication administration, Evaluate responses to treatment, Monitor vital signs and CBGs as ordered, Perform/monitor CIWA, COWS, AIMS and Fall Risk screenings as ordered, Perform wound care treatments as ordered.  Evaluation of Outcomes: Progressing   LCSW Treatment Plan for Primary Diagnosis: Severe recurrent major depression without psychotic features (HCC) Long Term Goal(s): Safe transition to appropriate  next level of care at discharge, Engage patient in therapeutic group addressing interpersonal concerns.  Short Term Goals: Engage patient in aftercare planning with referrals and resources, Increase social support and Increase skills for wellness and recovery  Therapeutic Interventions: Assess for all discharge needs, 1 to 1 time with Social worker, Explore available resources and support systems, Assess for adequacy in community support network, Educate family and significant other(s) on suicide prevention, Complete Psychosocial Assessment, Interpersonal group therapy.  Evaluation of Outcomes: Progressing   Progress in Treatment: Attending groups: Yes. Participating in groups: Yes. Taking medication as prescribed: Yes. Toleration medication: Yes. Family/Significant other contact made: No, will contact:  pt declined consent Patient understands diagnosis: Yes. Discussing patient identified problems/goals with staff: Yes. Medical problems stabilized or resolved: Yes. Denies suicidal/homicidal ideation: Yes. Issues/concerns per patient self-inventory: No. Other: none  New problem(s) identified: No, Describe:  none  New Short Term/Long Term Goal(s):  Patient Goals:  "get on a more even keel"  Discharge Plan or Barriers:   Reason for Continuation of Hospitalization: Depression Medication stabilization Withdrawal symptoms  Estimated Length of Stay:1-2 days  Attendees: Patient: 10/10/2017   Physician: Dr Jama Flavorsobos, MD 10/10/2017   Nursing: Liborio NixonPatrice White, RN 10/10/2017   RN Care Manager: 10/10/2017   Social Worker: Daleen SquibbGreg Xander Jutras, LCSW 10/10/2017   Recreational Therapist:  10/10/2017   Other:  10/10/2017   Other:  10/10/2017   Other: 10/10/2017        Scribe for Treatment Team: Lorri FrederickWierda, Lennox Leikam Jon, LCSW 10/10/2017 11:32 AM

## 2017-10-10 NOTE — [facility] Group Notes (Signed)
Group opened with brief discussion and psycho-social ed around grief and loss in relationships and in relation to self - identifying life patterns, circumstances, changes connected to losses. Established group norms and goals.  Group goal of establishing open and affirming space for members to process loss and experience with grief, normalize grief experience and provide psycho social education and grief support.. Group members engaged in facilitated dialog and support.  Engaged with Worden's four tasks of grief, identifying how these tasks show in their own experiences  David Small was present throughout group.  Did not engage in group discussion.

## 2017-10-10 NOTE — Plan of Care (Signed)
  Problem: Safety: Goal: Periods of time without injury will increase Outcome: Progressing   Problem: Health Behavior/Discharge Planning: Goal: Ability to identify changes in lifestyle to reduce recurrence of condition will improve Outcome: Progressing

## 2017-10-10 NOTE — Discharge Summary (Signed)
Physician Discharge Summary Note  Patient:  David LiasRobert Peterka is an 62 y.o., male MRN:  [id number] DOB:  1955-10-07 Patient phone:  [phone] (home)  Patient address:   [phone] [place] [id number],  Total Time spent with patient: 30 minutes  Date of Admission:  10/03/2017 Date of Discharge: 10/10/2017  Reason for Admission:  depression  Principal Problem: Severe recurrent major depression without psychotic features [facility](HCC) Discharge Diagnoses: Patient Active Problem List   Diagnosis Date Noted  . 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 Essential hypertension [I10]   . Severe recurrent major depression without psychotic features (HCC) [F33.2] 10/03/2017    Past Psychiatric History: 1 previous hospitalization for suicide attempt    Past Medical History:  Past Medical History:  Diagnosis Date  . Depression   . Hypertension    History reviewed. No pertinent surgical history. Family History: History reviewed. No pertinent family history. Family Psychiatric  History: Denies   Social History:  Social History   Substance and Sexual Activity  Alcohol Use Yes     Social History   Substance and Sexual Activity  Drug Use Not Currently    Social History   Socioeconomic History  . Marital status: Single    Spouse name: Not on file  . Number of children: Not on file  . Years of education: Not on file  . Highest education level: Not on file  Occupational History  . Not on file  Social Needs  . Financial resource strain: Not on file  . Food insecurity:    Worry: Not on file    Inability: Not on file  . Transportation needs:    Medical: Not on file    Non-medical: Not on file  Tobacco Use  . Smoking status: Current Every Day Smoker    Types: Cigarettes  . Smokeless tobacco: Never Used  Substance and Sexual Activity  . Alcohol use: Yes  . Drug use: Not Currently  . Sexual activity: Not Currently  Lifestyle  . Physical  activity:    Days per week: Not on file    Minutes per session: Not on file  . Stress: Not on file  Relationships  . Social connections:    Talks on phone: Not on file    Gets together: Not on file    Attends religious service: Not on file    Active member of club or organization: Not on file    Attends meetings of clubs or organizations: Not on file    Relationship status: Not on file  Other Topics Concern  . Not on file  Social History Narrative  . Not on file    Hospital Course:  62 y.o.malewho presents voluntarilyaccompanied reporting primary symptoms of depression. He states that he attempted to hand himself This morning. Pt endorsessocial withdrawal, loss of interest in usual pleasures, decreased concentration, fatigue, irritability, decreased sleep, decreased appetite and feelings of hopelessness. Pt endorses SI,deniesHI, psychosis. Pt States that he relapsed drinking last September after being sober for 2 1/2 years.PT describes a couple ofpast attempts,nohistory of violence. Pt states that onset of symptoms begangetting worse in the past month. Pt identifies primary stressors assome things that have been going on in his life that he does not want to elaborate on, "It is too long of a story". He feels alone and has no support, feels hopeless. Pt identifies primary residence asin an apartment with some roommates, but has been feeling so low lately that he went to  a hotel to be alone. Ptdenieslegal involvement. Ptdeniesabuse history. Pt identifiesprevious treatment as IPtreatment 4 years ago followed up by Op which he quit after about 1 year when he felt like it wasn't helping. Pt cannot remember the medication he was on. Pt hasfairinsight and judgment. Pt's memory is typical.?  [facility] Assessment: Patient is seen and examined. Patient is a 62 year old male with a past psychiatric history significant for major depression as well as alcohol use disorder.  [facility] with suicidal ideation. The patient presented with symptoms of depression including helplessness, hopelessness, worthlessness, fatigue, suicidal ideation. The patient admitted that he had attempted to hang himself 1 to 2 days prior to admission. He had a CT scan of the neck which was essentially negative. He also complained of a cough, and was found to have a pneumonia by chest x-ray. He stated that he his depression had worsened over the last 7 months. He had been sober for approximately 2-1/2 years, but then started drinking approximately 7 months ago. He stated since then his depression worsened. He stated that he had no friends, no family contacts, and had recently lost his part-time job. This put his housing in some significant problem. He admitted to a previous suicide attempt in 2015. He was hospitalized at that time. He is unsure what medication he took at that time, but believes it may have been Zoloft. He stated that after his hospitalization he was unable to afford that medication. He was admitted to the hospital for evaluation and stabilization.  After the above admission assessment, patients presenting symptoms were identified. His labs were reviewed and lipid panel showed cholesterol 221, LDL 139. HgbA1c 5.7. CMP glucose 102, BUN 22 and alkaline phosphate 34 otherwise normal. CBC with diff WBC 5.93 and hemoglobin 17.5. Ethanol < 10. Patient advised to follow-up with his PCP for further evaluation of abnormal labs.  He was medicated & discharged on; Campral 666 mgrs TID for alcohol dependence, Buspar 10 mgrs TID for anxiety, Lexapro 20 mgrs QDAY for depression, anxiety,  Cozaar / HCTZ for HTN, Trazodone 150 mgrs QHS PRN for insomnia.He tolerated his treatment regimen without any adverse effects reported.  During his hospital course, patient  was enrolled & actively  participated in the group counseling sessions. AA/NA  meetings were offered & held on this unit and patient actively particpated. He was able to verbalize coping skills that should help him cope better to maintain depression/mood stability and alcohol abuse upon returning home.  During the course of his hospitalization, patients improvement was monitored by observation and his daily report of symptom reduction. Evidence was further noted by  presentation of good affect and improved mood & behavior. Upon discharge,he denied any SIHI, AVH, delusional thoughts or paranoia. He also denied any substance withdrawal symptoms. His case was presented during treatment team meeting this morning. The team members all agreed that Molly MaduroRobert was both mentally & medically stable to be discharged to continue mental health care on an outpatient basis as noted below. He was provided with all the necessary information needed to make this appointment without problems. He was provided with a  [facility] to resume following discharge.Marland Kitchen. [facility] in no apparent distress. Transportation per his arrangement. [facility]He voiced no concerns regarding discharge disposition.   Physical Findings: AIMS: Facial and Oral Movements Muscles of Facial Expression: None, normal Lips and Perioral Area:  None, normal Jaw: None, normal Tongue: None, normal,Extremity Movements Upper (arms, wrists, hands, fingers): None, normal Lower (legs, knees, ankles, toes): None, normal, Trunk Movements Neck, shoulders, hips: None, normal, Overall Severity Severity of abnormal movements (highest score from questions above): None, normal Incapacitation due to abnormal movements: None, normal Patient's awareness of abnormal movements (rate only patient's report): No Awareness, Dental Status Current problems with teeth and/or dentures?: No Does patient usually wear dentures?: No  CIWA:  CIWA-Ar Total: 0 COWS:      Musculoskeletal: Strength & Muscle Tone: within normal limits Gait & Station: normal Patient leans: N/A  Psychiatric Specialty Exam: SEE SRA BY MD  Physical Exam  Nursing note and vitals reviewed. Constitutional: He is oriented to person, place, and time.  Neurological: He is alert and oriented to person, place, and time.    Review of Systems  Psychiatric/Behavioral: Negative for hallucinations, memory loss and suicidal ideas. Depression: improved. Substance abuse: hx of substance abuse.  Nervous/anxious: improved. Insomnia: improved.   All other systems reviewed and are negative.   Blood pressure (!) 159/91, pulse 62, temperature 98.2 F (36.8 C), temperature source Oral, resp. rate 18, height 6' (1.829 m), weight 98 kg (216 lb).Body mass index is 29.29 kg/m.    Have you used any form of tobacco in the last 30 days? (Cigarettes, Smokeless Tobacco, Cigars, and/or Pipes): Yes  Has this patient used any form of tobacco in the last 30 days? (Cigarettes, Smokeless Tobacco, Cigars, and/or Pipes) Yes, Yes, A prescription for an FDA-approved tobacco cessation medication was offered at discharge and the patient refused  Blood Alcohol level:  Lab Results  Component Value Date   ETH <10 10/03/2017    Metabolic Disorder Labs:  Lab Results  Component Value Date   HGBA1C 5.7 (H) 10/04/2017   MPG 116.89 10/04/2017   No results found for: PROLACTIN Lab Results  Component Value Date   CHOL 221 (H) 10/04/2017   TRIG 125 10/04/2017   HDL 57 10/04/2017   CHOLHDL 3.9 10/04/2017   VLDL 25 10/04/2017   LDLCALC 139 (H) 10/04/2017    See Psychiatric Specialty Exam and Suicide Risk Assessment completed by Attending Physician prior to discharge.  Discharge destination:  [facility]  Is patient on multiple antipsychotic therapies at discharge:  No   Has Patient had three or more failed trials of antipsychotic monotherapy by history:  No  Recommended Plan for Multiple Antipsychotic  Therapies: NA  Discharge Instructions    Diet - low sodium heart healthy   Complete by:  As directed    Diet - low sodium heart healthy   Complete by:  As directed    Discharge instructions   Complete by:  As directed    Follow-up with outpatient provider   Increase activity slowly   Complete by:  As directed      Allergies as of 10/10/2017   No Known Allergies     Medication List    TAKE these medications     Indication  acamprosate 333 MG tablet Commonly known as:  CAMPRAL Take 1 tablet (333 mg total) by mouth 3 (three) times daily.  Indication:  Excessive Use of Alcohol   amoxicillin-clavulanate 1000-62.5 MG 12 hr tablet Commonly known as:  AUGMENTIN XR Take 2 tablets by mouth every 12 (twelve) hours.  Indication:  Community Acquired Pneumonia   busPIRone 10 MG tablet Commonly known as:  BUSPAR Take 1 tablet (10 mg total) by mouth 3 (three) times daily.  Indication:  Anxiety Disorder  escitalopram 20 MG tablet Commonly known as:  LEXAPRO Take 1 tablet (20 mg total) by mouth daily.  Indication:  Major Depressive Disorder   hydrochlorothiazide 25 MG tablet Commonly known as:  HYDRODIURIL Take 1 tablet (25 mg total) by mouth daily.  Indication:  High Blood Pressure Disorder   lisinopril 20 MG tablet Commonly known as:  PRINIVIL,ZESTRIL Take 1 tablet (20 mg total) by mouth daily.  Indication:  High Blood Pressure Disorder   losartan 100 MG tablet Commonly known as:  COZAAR Take 1 tablet (100 mg total) by mouth daily. Start taking on:  10/11/2017  Indication:  High Blood Pressure Disorder   traZODone 150 MG tablet Commonly known as:  DESYREL Take 1 tablet (150 mg total) by mouth at bedtime and may repeat dose one time if needed.  Indication:  Trouble Sleeping        Follow-up recommendations:  Follow up with your outpatient provided for any medical issues. Activity  as recommended by your primary care provider. Recommend heart healthy low sodium diet.    Comments:  Patient is instructed prior to discharge to: Take all medications as prescribed by his/her mental healthcare provider. Report any adverse effects and or reactions from the medicines to his/her outpatient provider promptly. Patient has been instructed & cautioned: To not engage in alcohol and or illegal drug use while on prescription medicines. In the event of worsening symptoms, patient is instructed to call the crisis hotline, 911 and or go to the nearest ED for appropriate evaluation and treatment of symptoms. To follow-up with his/her primary care provider for your other medical issues, concerns and or health care needs.  Signed: Denzil Magnuson, NP 10/10/2017, 10:55 AM

## 2017-10-10 NOTE — Progress Notes (Signed)
[facility]. Pt is currently in review for possible placement.   Sherial Ebrahim S. Alan RipperHolloway, MSW, LCSW Clinical Social Worker 10/10/2017 10:08 AM

## 2017-10-10 NOTE — Plan of Care (Signed)
  Problem: Activity: Goal: Sleeping patterns will improve Outcome: Progressing Note:  Slept 6.75 hours last night per flowsheet.    

## 2017-10-10 NOTE — Progress Notes (Deleted)
Physician Discharge Summary Note  Patient:  David LiasRobert Peterka is an 62 y.o., male MRN:  [id number] DOB:  1955-10-07 Patient phone:  [phone] (home)  Patient address:   [phone] [place] [id number],  Total Time spent with patient: 30 minutes  Date of Admission:  10/03/2017 Date of Discharge: 10/10/2017  Reason for Admission:  depression  Principal Problem: Severe recurrent major depression without psychotic features [facility](HCC) Discharge Diagnoses: Patient Active Problem List   Diagnosis Date Noted  . 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 Essential hypertension [I10]   . Severe recurrent major depression without psychotic features (HCC) [F33.2] 10/03/2017    Past Psychiatric History: 1 previous hospitalization for suicide attempt    Past Medical History:  Past Medical History:  Diagnosis Date  . Depression   . Hypertension    History reviewed. No pertinent surgical history. Family History: History reviewed. No pertinent family history. Family Psychiatric  History: Denies   Social History:  Social History   Substance and Sexual Activity  Alcohol Use Yes     Social History   Substance and Sexual Activity  Drug Use Not Currently    Social History   Socioeconomic History  . Marital status: Single    Spouse name: Not on file  . Number of children: Not on file  . Years of education: Not on file  . Highest education level: Not on file  Occupational History  . Not on file  Social Needs  . Financial resource strain: Not on file  . Food insecurity:    Worry: Not on file    Inability: Not on file  . Transportation needs:    Medical: Not on file    Non-medical: Not on file  Tobacco Use  . Smoking status: Current Every Day Smoker    Types: Cigarettes  . Smokeless tobacco: Never Used  Substance and Sexual Activity  . Alcohol use: Yes  . Drug use: Not Currently  . Sexual activity: Not Currently  Lifestyle  . Physical  activity:    Days per week: Not on file    Minutes per session: Not on file  . Stress: Not on file  Relationships  . Social connections:    Talks on phone: Not on file    Gets together: Not on file    Attends religious service: Not on file    Active member of club or organization: Not on file    Attends meetings of clubs or organizations: Not on file    Relationship status: Not on file  Other Topics Concern  . Not on file  Social History Narrative  . Not on file    Hospital Course:  62 y.o.malewho presents voluntarilyaccompanied reporting primary symptoms of depression. He states that he attempted to hand himself This morning. Pt endorsessocial withdrawal, loss of interest in usual pleasures, decreased concentration, fatigue, irritability, decreased sleep, decreased appetite and feelings of hopelessness. Pt endorses SI,deniesHI, psychosis. Pt States that he relapsed drinking last September after being sober for 2 1/2 years.PT describes a couple ofpast attempts,nohistory of violence. Pt states that onset of symptoms begangetting worse in the past month. Pt identifies primary stressors assome things that have been going on in his life that he does not want to elaborate on, "It is too long of a story". He feels alone and has no support, feels hopeless. Pt identifies primary residence asin an apartment with some roommates, but has been feeling so low lately that he went to  a hotel to be alone. Ptdenieslegal involvement. Ptdeniesabuse history. Pt identifiesprevious treatment as IPtreatment 4 years ago followed up by Op which he quit after about 1 year when he felt like it wasn't helping. Pt cannot remember the medication he was on. Pt hasfairinsight and judgment. Pt's memory is typical.?  [facility] Assessment: Patient is seen and examined. Patient is a 62 year old male with a past psychiatric history significant for major depression as well as alcohol use disorder.  [facility] with suicidal ideation. The patient presented with symptoms of depression including helplessness, hopelessness, worthlessness, fatigue, suicidal ideation. The patient admitted that he had attempted to hang himself 1 to 2 days prior to admission. He had a CT scan of the neck which was essentially negative. He also complained of a cough, and was found to have a pneumonia by chest x-ray. He stated that he his depression had worsened over the last 7 months. He had been sober for approximately 2-1/2 years, but then started drinking approximately 7 months ago. He stated since then his depression worsened. He stated that he had no friends, no family contacts, and had recently lost his part-time job. This put his housing in some significant problem. He admitted to a previous suicide attempt in 2015. He was hospitalized at that time. He is unsure what medication he took at that time, but believes it may have been Zoloft. He stated that after his hospitalization he was unable to afford that medication. He was admitted to the hospital for evaluation and stabilization.  After the above admission assessment, patients presenting symptoms were identified. His labs were reviewed and lipid panel showed cholesterol 221, LDL 139. HgbA1c 5.7. CMP glucose 102, BUN 22 and alkaline phosphate 34 otherwise normal. CBC with diff WBC 5.93 and hemoglobin 17.5. Ethanol < 10. Patient advised to follow-up with his PCP for further evaluation of abnormal labs.  He was medicated & discharged on; Campral 666 mgrs TID for alcohol dependence, Buspar 10 mgrs TID for anxiety, Lexapro 20 mgrs QDAY for depression, anxiety,  Cozaar / HCTZ for HTN, Trazodone 150 mgrs QHS PRN for insomnia.He tolerated his treatment regimen without any adverse effects reported.  During his hospital course, patient  was enrolled & actively  participated in the group counseling sessions. AA/NA  meetings were offered & held on this unit and patient actively particpated. He was able to verbalize coping skills that should help him cope better to maintain depression/mood stability and alcohol abuse upon returning home.  During the course of his hospitalization, patients improvement was monitored by observation and his daily report of symptom reduction. Evidence was further noted by  presentation of good affect and improved mood & behavior. Upon discharge,he denied any SIHI, AVH, delusional thoughts or paranoia. He also denied any substance withdrawal symptoms. His case was presented during treatment team meeting this morning. The team members all agreed that Molly MaduroRobert was both mentally & medically stable to be discharged to continue mental health care on an outpatient basis as noted below. He was provided with all the necessary information needed to make this appointment without problems. He was provided with a  pres[facility] to resume following discharge.Marland Kitchen. [facility] in no apparent distress. Transportation per his arrangement. [facility]He voiced no concerns regarding discharge disposition.   Physical Findings: AIMS: Facial and Oral Movements Muscles of Facial Expression: None, normal Lips and Perioral Area:  None, normal Jaw: None, normal Tongue: None, normal,Extremity Movements Upper (arms, wrists, hands, fingers): None, normal Lower (legs, knees, ankles, toes): None, normal, Trunk Movements Neck, shoulders, hips: None, normal, Overall Severity Severity of abnormal movements (highest score from questions above): None, normal Incapacitation due to abnormal movements: None, normal Patient's awareness of abnormal movements (rate only patient's report): No Awareness, Dental Status Current problems with teeth and/or dentures?: No Does patient usually wear dentures?: No  CIWA:  CIWA-Ar Total: 0 COWS:      Musculoskeletal: Strength & Muscle Tone: within normal limits Gait & Station: normal Patient leans: N/A  Psychiatric Specialty Exam: SEE SRA BY MD  Physical Exam  Nursing note and vitals reviewed. Constitutional: He is oriented to person, place, and time.  Neurological: He is alert and oriented to person, place, and time.    Review of Systems  Psychiatric/Behavioral: Negative for hallucinations, memory loss and suicidal ideas. Depression: improved. Substance abuse: hx of substance abuse.  Nervous/anxious: improved. Insomnia: improved.   All other systems reviewed and are negative.   Blood pressure (!) 159/91, pulse 62, temperature 98.2 F (36.8 C), temperature source Oral, resp. rate 18, height 6' (1.829 m), weight 98 kg (216 lb).Body mass index is 29.29 kg/m.    Have you used any form of tobacco in the last 30 days? (Cigarettes, Smokeless Tobacco, Cigars, and/or Pipes): Yes  Has this patient used any form of tobacco in the last 30 days? (Cigarettes, Smokeless Tobacco, Cigars, and/or Pipes) Yes, Yes, A prescription for an FDA-approved tobacco cessation medication was offered at discharge and the patient refused  Blood Alcohol level:  Lab Results  Component Value Date   ETH <10 10/03/2017    Metabolic Disorder Labs:  Lab Results  Component Value Date   HGBA1C 5.7 (H) 10/04/2017   MPG 116.89 10/04/2017   No results found for: PROLACTIN Lab Results  Component Value Date   CHOL 221 (H) 10/04/2017   TRIG 125 10/04/2017   HDL 57 10/04/2017   CHOLHDL 3.9 10/04/2017   VLDL 25 10/04/2017   LDLCALC 139 (H) 10/04/2017    See Psychiatric Specialty Exam and Suicide Risk Assessment completed by Attending Physician prior to discharge.  Discharge destination:  [facility]  Is patient on multiple antipsychotic therapies at discharge:  No   Has Patient had three or more failed trials of antipsychotic monotherapy by history:  No  Recommended Plan for Multiple Antipsychotic  Therapies: NA  Discharge Instructions    Diet - low sodium heart healthy   Complete by:  As directed    Diet - low sodium heart healthy   Complete by:  As directed    Discharge instructions   Complete by:  As directed    Follow-up with outpatient provider   Increase activity slowly   Complete by:  As directed      Allergies as of 10/10/2017   No Known Allergies     Medication List    TAKE these medications     Indication  acamprosate 333 MG tablet Commonly known as:  CAMPRAL Take 1 tablet (333 mg total) by mouth 3 (three) times daily.  Indication:  Excessive Use of Alcohol   amoxicillin-clavulanate 1000-62.5 MG 12 hr tablet Commonly known as:  AUGMENTIN XR Take 2 tablets by mouth every 12 (twelve) hours.  Indication:  Community Acquired Pneumonia   busPIRone 10 MG tablet Commonly known as:  BUSPAR Take 1 tablet (10 mg total) by mouth 3 (three) times daily.  Indication:  Anxiety Disorder  escitalopram 20 MG tablet Commonly known as:  LEXAPRO Take 1 tablet (20 mg total) by mouth daily.  Indication:  Major Depressive Disorder   hydrochlorothiazide 25 MG tablet Commonly known as:  HYDRODIURIL Take 1 tablet (25 mg total) by mouth daily.  Indication:  High Blood Pressure Disorder   lisinopril 20 MG tablet Commonly known as:  PRINIVIL,ZESTRIL Take 1 tablet (20 mg total) by mouth daily.  Indication:  High Blood Pressure Disorder   losartan 100 MG tablet Commonly known as:  COZAAR Take 1 tablet (100 mg total) by mouth daily. Start taking on:  10/11/2017  Indication:  High Blood Pressure Disorder   traZODone 150 MG tablet Commonly known as:  DESYREL Take 1 tablet (150 mg total) by mouth at bedtime and may repeat dose one time if needed.  Indication:  Trouble Sleeping        Follow-up recommendations:  Follow up with your outpatient provided for any medical issues. Activity  as recommended by your primary care provider. Recommend heart healthy low sodium diet.    Comments:  Patient is instructed prior to discharge to: Take all medications as prescribed by his/her mental healthcare provider. Report any adverse effects and or reactions from the medicines to his/her outpatient provider promptly. Patient has been instructed & cautioned: To not engage in alcohol and or illegal drug use while on prescription medicines. In the event of worsening symptoms, patient is instructed to call the crisis hotline, 911 and or go to the nearest ED for appropriate evaluation and treatment of symptoms. To follow-up with his/her primary care provider for your other medical issues, concerns and or health care needs.  Signed: Denzil Magnuson, NP 10/10/2017, 11:08 AMPatient ID: David Small, male   DOB: January 14, 1956, 62 y.o.   MRN: [id number]

## 2017-10-10 NOTE — Progress Notes (Addendum)
[facility] initially agreed to accept pt once stable psychiatrically.  [name] from [facility] called back today and reported that after speaking to the nurses, they were unable to accept pt as "the problem is more mental health than substance".  CSW updated pt on this and he took the news fairly well. David Small, MSW, LCSW Clinical Social Worker 10/10/2017 1:01 PM   CSW met with client to discuss options.  Pt is still interested in residential treatment and we discussed [facility].  Also discussed [facility] admission and pt agreed to fill out application.  Pt does have a friend in [place] he could potentially stay with but he is not excited about this option. David Small, MSW, LCSW Clinical Social Worker 10/10/2017 3:12 PM

## 2017-10-10 NOTE — Progress Notes (Addendum)
[facility] MD Progress Note  10/10/2017 11:11 AM David Small  MRN:  [id number]  Subjective:  Patient reports he is feeling " I am feeling more hopefull now that I know I may be going to [facility]." .  Objective :  Face to face evaluation completed, case discussed with treatment team and chart reviewed. David Small is a 62 year old male, lives alone , history of depression and alcohol use disorder. Presented due to worsening depression and suicidal ideations.  During this evaluation, patient is alert ad oriented x4, calm and cooperative. He endorses that mood continues to slowly improved and reports less feelings of hopelessness as he is hopeful that he will go to [facility] for his alcohol abuse following discharge. He continues  to present with a depressed mood although when discussing discharge plan, his mood and affect does brighten. He denies any passive or active SI during this evaluation. He does continue to talk about lack of support once discharge and reports he feel as though if he does not go to another replacements to help him better mange his alcohol abuse, he make backslide. He denies any withdrawal symptoms and none are observed. Denies HI or AVH and does not appear internally preoccupied.  He is complaint with medication regime and  denies medication side effects. He continues to actively  participate in unit activities. Reports both appetite and resting pattern continues to improve. At this time, he is contracting for safety on the unit.  Principal Problem: Severe recurrent major depression without psychotic features ([place]) Diagnosis:   Patient Active Problem List   Diagnosis Date Noted  . Alcohol dependence with alcohol-induced mood disorder ([place]) [F10.24]   . Essential hypertension [I10]   . Severe recurrent major depression without psychotic features ([place]) [F33.2] 10/03/2017   Total Time spent with patient: 20 minutes  Past Psychiatric History:   Past Medical History:  Past Medical History:   Diagnosis Date  . Depression   . Hypertension    History reviewed. No pertinent surgical history. Family History: History reviewed. No pertinent family history. Family Psychiatric  History:  Social History:  Social History   Substance and Sexual Activity  Alcohol Use Yes     Social History   Substance and Sexual Activity  Drug Use Not Currently    Social History   Socioeconomic History  . Marital status: Single    Spouse name: Not on file  . Number of children: Not on file  . Years of education: Not on file  . Highest education level: Not on file  Occupational History  . Not on file  Social Needs  . Financial resource strain: Not on file  . Food insecurity:    Worry: Not on file    Inability: Not on file  . Transportation needs:    Medical: Not on file    Non-medical: Not on file  Tobacco Use  . Smoking status: Current Every Day Smoker    Types: Cigarettes  . Smokeless tobacco: Never Used  Substance and Sexual Activity  . Alcohol use: Yes  . Drug use: Not Currently  . Sexual activity: Not Currently  Lifestyle  . Physical activity:    Days per week: Not on file    Minutes per session: Not on file  . Stress: Not on file  Relationships  . Social connections:    Talks on phone: Not on file    Gets together: Not on file    Attends religious service: Not on file  Active member of club or organization: Not on file    Attends meetings of clubs or organizations: Not on file    Relationship status: Not on file  Other Topics Concern  . Not on file  Social History Narrative  . Not on file   Additional Social History:   Sleep: improving   Appetite:  improving   Current Medications: Current Facility-Administered Medications  Medication Dose Route Frequency Provider Last Rate Last Dose  . acamprosate (CAMPRAL) tablet 333 mg  333 mg Oral TID Sharma Covert, MD   333 mg at 10/08/17 0813  . acetaminophen (TYLENOL) tablet 650 mg  650 mg Oral Q6H PRN Lindon Romp A, NP   650 mg at 10/10/17 0815  . albuterol (PROVENTIL) (2.5 MG/3ML) 0.083% nebulizer solution 2.5 mg  2.5 mg Nebulization Q4H PRN Sharma Covert, MD   2.5 mg at 10/06/17 1144  . alum & mag hydroxide-simeth (MAALOX/MYLANTA) 200-200-20 MG/5ML suspension 30 mL  30 mL Oral Q4H PRN Lindon Romp A, NP      . amoxicillin-clavulanate (AUGMENTIN XR) 1000-62.5 MG per 12 hr tablet 2 tablet  2 tablet Oral Q12H Lindon Romp A, NP   2 tablet at 10/10/17 0811  . busPIRone (BUSPAR) tablet 10 mg  10 mg Oral TID Sharma Covert, MD   10 mg at 10/10/17 0811  . cloNIDine (CATAPRES) tablet 0.1 mg  0.1 mg Oral TID PRN Sharma Covert, MD      . escitalopram (LEXAPRO) tablet 20 mg  20 mg Oral Daily Sharma Covert, MD   20 mg at 10/10/17 0811  . hydrochlorothiazide (HYDRODIURIL) tablet 25 mg  25 mg Oral Daily Lindon Romp A, NP   25 mg at 10/10/17 0811  . losartan (COZAAR) tablet 100 mg  100 mg Oral Daily Sharma Covert, MD   100 mg at 10/10/17 0811  . magnesium hydroxide (MILK OF MAGNESIA) suspension 30 mL  30 mL Oral Daily PRN Lindon Romp A, NP      . nicotine (NICODERM CQ - dosed in mg/24 hours) patch 21 mg  21 mg Transdermal Daily Sharma Covert, MD   21 mg at 10/10/17 0811  . thiamine (VITAMIN B-1) tablet 100 mg  100 mg Oral Daily Lindon Romp A, NP   100 mg at 10/10/17 0811  . traZODone (DESYREL) tablet 150 mg  150 mg Oral QHS PRN Pantera Winterrowd, Myer Peer, MD   150 mg at 10/09/17 2129    Lab Results:  Results for orders placed or performed during the hospital encounter of 10/03/17 (from the past 48 hour(s))  Comprehensive metabolic panel     Status: Abnormal   Collection Time: 10/09/17  6:25 AM  Result Value Ref Range   Sodium 141 135 - 145 mmol/L   Potassium 4.6 3.5 - 5.1 mmol/L   Chloride 103 101 - 111 mmol/L   CO2 32 22 - 32 mmol/L   Glucose, Bld 102 (H) 65 - 99 mg/dL   BUN 22 (H) 6 - 20 mg/dL   Creatinine, Ser 1.07 0.61 - 1.24 mg/dL   Calcium 9.3 8.9 - 10.3 mg/dL   Total Protein  7.0 6.5 - 8.1 g/dL   Albumin 4.1 3.5 - 5.0 g/dL   AST 18 15 - 41 U/L   ALT 22 17 - 63 U/L   Alkaline Phosphatase 34 (L) 38 - 126 U/L   Total Bilirubin 0.4 0.3 - 1.2 mg/dL   GFR calc non Af Amer >60 >60 mL/min  GFR calc Af Amer >60 >60 mL/min    Comment: (NOTE) The eGFR has been calculated using the CKD EPI equation. This calculation has not been validated in all clinical situations. eGFR's persistently <60 mL/min signify possible Chronic Kidney Disease.    Anion gap 6 5 - 15    Comment: Performed at [facility], [place] [address] [place] [address] [place], [place] [address]    Blood Alcohol level:  Lab Results  Component Value Date   ETH <10 62/22/9798    Metabolic Disorder Labs: Lab Results  Component Value Date   HGBA1C 5.7 (H) 10/04/2017   MPG 116.89 10/04/2017   No results found for: PROLACTIN Lab Results  Component Value Date   CHOL 221 (H) 10/04/2017   TRIG 125 10/04/2017   HDL 57 10/04/2017   CHOLHDL 3.9 10/04/2017   VLDL 25 10/04/2017   LDLCALC 139 (H) 10/04/2017    Physical Findings: AIMS: Facial and Oral Movements Muscles of Facial Expression: None, normal Lips and Perioral Area: None, normal Jaw: None, normal Tongue: None, normal,Extremity Movements Upper (arms, wrists, hands, fingers): None, normal Lower (legs, knees, ankles, toes): None, normal, Trunk Movements Neck, shoulders, hips: None, normal, Overall Severity Severity of abnormal movements (highest score from questions above): None, normal Incapacitation due to abnormal movements: None, normal Patient's awareness of abnormal movements (rate only patient's report): No Awareness, Dental Status Current problems with teeth and/or dentures?: No Does patient usually wear dentures?: No  CIWA:  CIWA-Ar Total: 0 COWS:     Musculoskeletal: Strength & Muscle Tone: within normal limits Gait & Station: normal Patient leans: N/A  Psychiatric Specialty Exam: Physical Exam  Nursing note and vitals  reviewed. Constitutional: He is oriented to person, place, and time.  Neurological: He is alert and oriented to person, place, and time.    Review of Systems  Psychiatric/Behavioral: Positive for depression and substance abuse. Negative for hallucinations, memory loss and suicidal ideas. The patient is nervous/anxious. The patient does not have insomnia.   All other systems reviewed and are negative.  denies headache, no chest pain, no shortness of breath at room air, no vomiting , no fever or chills .  Blood pressure (!) 159/91, pulse 62, temperature 98.2 F (36.8 C), temperature source Oral, resp. rate 18, height 6' (1.829 m), weight 98 kg (216 lb), SpO2 96 %.Body mass index is 29.29 kg/m.  General Appearance: fairly groomed   Eye Contact:  Good  Speech:  Normal Rate  Volume:  Normal  Mood:  reports partially improved mood   Affect:  constricted, anxious   Thought Process:  Linear and Descriptions of Associations: Intact  Orientation:  Full (Time, Place, and Person)  Thought Content:  denies hallucinations, no delusions, not internally preoccupied   Suicidal Thoughts:  No denies suicidal or self injurious ideations at this time, contracts for safety on unit , denies homicidal or violent ideations   Homicidal Thoughts:  No  Memory:  recent and remote grossly intact   Judgement:  Other:  improving   Insight:  Fair and improving   Psychomotor Activity:  Normal  Concentration:  Concentration: Good and Attention Span: Good  Recall:  Good  Fund of Knowledge:  Good  Language:  Good  Akathisia:  Negative  Handed:  Right  AIMS (if indicated):     Assets:  Desire for Improvement Resilience  ADL's:  Intact  Cognition:  WNL  Sleep:  Number of Hours: 6.75   Assessment - Patient continues to endorse partial improvement in  symptoms. He denies passive or active SI and has maintained safety on the unit by not engaging in any self-harming acts. Denies medication side effects. Interested in  going to [facility] however, patient was not accepted to the program. Per CSW, patient was denied as [facility] felt his condition was more mental health related. CSW will continue to work on discharge disposition.   Treatment Plan Summary: Reviewed current treatment plan. Will continue the following plan without adjustments to medications at this time.  Daily contact with patient to assess and evaluate symptoms and progress in treatment, Medication management, Plan inpatient treatment  and medications as below  Encourage group and milieu participation to work on coping skills and symptom reduction Encourage efforts to work on sobriety and relapse prevention  Treatment team working on disposition planning - see above  Continue Campral 666 mgrs TID for alcohol dependence Continue Buspar 10 mgrs TID for anxiety Continue Lexapro 20 mgrs QDAY for depression, anxiety Continue Cozaar / HCTZ for HTN Continue Trazodone 150 mgrs QHS PRN for insomnia  Mordecai Maes, NP 10/10/2017, 11:11 AM   Patient ID: Wynonia Lawman, male   DOB: February 14, 1956, 62 y.o.   MRN: [id number] .Marland KitchenMarland KitchenAgree with NP Progress Note

## 2017-10-11 MED ORDER — NALTREXONE HCL 50 MG PO TABS
25.0000 mg | ORAL_TABLET | Freq: Every day | ORAL | Status: DC
  Administered 2017-10-11 – 2017-10-12 (×2): 25 mg via ORAL
  Filled 2017-10-11 (×4): qty 1

## 2017-10-11 NOTE — Progress Notes (Signed)
[facility] Group Notes:  (Nursing/MHT/Case Management/Adjunct)  Date:  10/11/2017  Time:  2030 Type of Therapy:  wrap up group  Participation Level:  Active  Participation Quality:  Appropriate, Attentive, Sharing and Supportive  Affect:  Appropriate and Irritable  Cognitive:  Appropriate  Insight:  Improving  Engagement in Group:  Engaged  Modes of Intervention:  Clarification, Education and Support  Summary of Progress/Problems:  David Small, David Small 10/11/2017, 9:07 PM

## 2017-10-11 NOTE — Progress Notes (Addendum)
Phone call from [place] at [employer] has been met and total out of pocket cost would be $150.  Needs to do prescreen with pt on the phone and can talk about admission date after that. Winferd Humphrey, MSW, LCSW Clinical Social Worker 10/11/2017 9:31 AM   Prescreen completed with Roderic Palau.  He will call back once transportation is in place. Winferd Humphrey, MSW, LCSW Clinical Social Worker 10/11/2017 10:00 AM   Roderic Palau will call back with time that driver will be at [facility] on Friday, 10/12/17.  It will be after lunch. Winferd Humphrey, MSW, LCSW Clinical Social Worker 10/11/2017 2:45 PM

## 2017-10-11 NOTE — [facility] Group Notes (Signed)
[facility] LCSW Group Therapy Note  Date/Time: 10/11/17, 1315  Type of Therapy/Topic:  Group Therapy:  Balance in Life  Participation Level:  active  Description of Group:    This group will address the concept of balance and how it feels and looks when one is unbalanced. Patients will be encouraged to process areas in their lives that are out of balance, and identify reasons for remaining unbalanced. Facilitators will guide patients utilizing problem- solving interventions to address and correct the stressor making their life unbalanced. Understanding and applying boundaries will be explored and addressed for obtaining  and maintaining a balanced life. Patients will be encouraged to explore ways to assertively make their unbalanced needs known to significant others in their lives, using other group members and facilitator for support and feedback.  Therapeutic Goals: 1. Patient will identify two or more emotions or situations they have that consume much of in their lives. 2. Patient will identify signs/triggers that life has become out of balance:  3. Patient will identify two ways to set boundaries in order to achieve balance in their lives:  4. Patient will demonstrate ability to communicate their needs through discussion and/or role plays  Summary of Patient Progress:Pt shared that family, mental/emotional, financial, and physical are areas that are out of balance in his life.  Pt was active in group discussion regarding recognizing and addressing areas of life that are out of balance.            Therapeutic Modalities:   Cognitive Behavioral Therapy Solution-Focused Therapy Assertiveness Training  Daleen SquibbGreg Abishai Viegas, [place]

## 2017-10-11 NOTE — Progress Notes (Signed)
Pt reports his day started out great when he thought he was going [facility], but when he found out he had been declined, he became frustrated and feeling hopeless.  He says he has a friend he can stay with until his check comes, but he is afraid he will relapse because his friend works and he will have a lot of alone time in the home.  He denies SI/HI/AVH at this time, but says his hopelessness causes him to feel "what's the use".  Pt was encouraged to discuss his feeling with the CSW in the morning.  Support and encouragement offered.  Discharge plans are still in process.  Safety maintained with q15 minute checks.

## 2017-10-11 NOTE — Progress Notes (Signed)
[facility] MD Progress Note  10/11/2017 12:35 PM David Small  MRN:  [id number]  Subjective: Patient reports he felt more depressed and hopeless after he found out that he was not accepted to [facility].  Today, however, he is feeling " a little better", which he attributes in part to working with [place] on identifying other disposition options.  States that he is reluctant to return to friend's house where he had been staying because "I know I will go back to square one, slide back. States " I really need a rehab". Thus far tolerating medications well but states Campral was not well tolerated due to nausea, does not want to continue this medication.   Objective : I have discussed case with treatment team and have met with patient. He is a 62 year old male, lives alone, reports history of alcohol dependence and depression.  Presented due to worsening depression, suicidal ideations . Patient had been excited about the possibility of going to [facility] ( rehab) , and felt dejected and hopeless after he found out he was not accepted there.  Today denies suicidal ideations, and presents future oriented, focusing mostly on disposition plans.  As above, he is motivated and going to a rehab setting at discharge, expresses concern of high risk of relapse and worsening depression if her returns to community at this time. Tolerating medications well except for Campral which caused nausea.  We discussed other treatment options such as naltrexone. Behavior on unit in good control, no disruptive or agitated behaviors.  Currently denies suicidal ideations. Of note, patient was recently diagnosed with pneumonia.  At this time denies fever or chills, no pleuritic pain, no coughing, no shortness of breath at room air.   Principal Problem: Severe recurrent major depression without psychotic features ([place]) Diagnosis:   Patient Active Problem List   Diagnosis Date Noted  . Alcohol dependence with alcohol-induced mood disorder ([place])  [F10.24]   . Essential hypertension [I10]   . Severe recurrent major depression without psychotic features ([place]) [F33.2] 10/03/2017   Total Time spent with patient: 20 minutes  Past Psychiatric History:   Past Medical History:  Past Medical History:  Diagnosis Date  . Depression   . Hypertension    History reviewed. No pertinent surgical history. Family History: History reviewed. No pertinent family history. Family Psychiatric  History:  Social History:  Social History   Substance and Sexual Activity  Alcohol Use Yes     Social History   Substance and Sexual Activity  Drug Use Not Currently    Social History   Socioeconomic History  . Marital status: Single    Spouse name: Not on file  . Number of children: Not on file  . Years of education: Not on file  . Highest education level: Not on file  Occupational History  . Not on file  Social Needs  . Financial resource strain: Not on file  . Food insecurity:    Worry: Not on file    Inability: Not on file  . Transportation needs:    Medical: Not on file    Non-medical: Not on file  Tobacco Use  . Smoking status: Current Every Day Smoker    Types: Cigarettes  . Smokeless tobacco: Never Used  Substance and Sexual Activity  . Alcohol use: Yes  . Drug use: Not Currently  . Sexual activity: Not Currently  Lifestyle  . Physical activity:    Days per week: Not on file    Minutes per session: Not on file  .  Stress: Not on file  Relationships  . Social connections:    Talks on phone: Not on file    Gets together: Not on file    Attends religious service: Not on file    Active member of club or organization: Not on file    Attends meetings of clubs or organizations: Not on file    Relationship status: Not on file  Other Topics Concern  . Not on file  Social History Narrative  . Not on file   Additional Social History:   Sleep: improving   Appetite:  improving   Current Medications: Current  Facility-Administered Medications  Medication Dose Route Frequency Provider Last Rate Last Dose  . acamprosate (CAMPRAL) tablet 333 mg  333 mg Oral TID Sharma Covert, MD   333 mg at 10/08/17 0813  . acetaminophen (TYLENOL) tablet 650 mg  650 mg Oral Q6H PRN Lindon Romp A, NP   650 mg at 10/10/17 0815  . albuterol (PROVENTIL) (2.5 MG/3ML) 0.083% nebulizer solution 2.5 mg  2.5 mg Nebulization Q4H PRN Sharma Covert, MD   2.5 mg at 10/06/17 1144  . alum & mag hydroxide-simeth (MAALOX/MYLANTA) 200-200-20 MG/5ML suspension 30 mL  30 mL Oral Q4H PRN Lindon Romp A, NP      . busPIRone (BUSPAR) tablet 10 mg  10 mg Oral TID Sharma Covert, MD   10 mg at 10/11/17 0759  . cloNIDine (CATAPRES) tablet 0.1 mg  0.1 mg Oral TID PRN Sharma Covert, MD   0.1 mg at 10/10/17 1206  . escitalopram (LEXAPRO) tablet 20 mg  20 mg Oral Daily Sharma Covert, MD   20 mg at 10/11/17 0759  . hydrochlorothiazide (HYDRODIURIL) tablet 25 mg  25 mg Oral Daily Lindon Romp A, NP   25 mg at 10/11/17 0759  . losartan (COZAAR) tablet 100 mg  100 mg Oral Daily Sharma Covert, MD   100 mg at 10/11/17 0759  . magnesium hydroxide (MILK OF MAGNESIA) suspension 30 mL  30 mL Oral Daily PRN Lindon Romp A, NP      . nicotine (NICODERM CQ - dosed in mg/24 hours) patch 21 mg  21 mg Transdermal Daily Sharma Covert, MD   21 mg at 10/11/17 0758  . thiamine (VITAMIN B-1) tablet 100 mg  100 mg Oral Daily Lindon Romp A, NP   100 mg at 10/11/17 0759  . traZODone (DESYREL) tablet 150 mg  150 mg Oral QHS PRN Nyko Gell, Myer Peer, MD   150 mg at 10/10/17 2112    Lab Results:  No results found for this or any previous visit (from the past 48 hour(s)).  Blood Alcohol level:  Lab Results  Component Value Date   ETH <10 93/81/8299    Metabolic Disorder Labs: Lab Results  Component Value Date   HGBA1C 5.7 (H) 10/04/2017   MPG 116.89 10/04/2017   No results found for: PROLACTIN Lab Results  Component Value Date    CHOL 221 (H) 10/04/2017   TRIG 125 10/04/2017   HDL 57 10/04/2017   CHOLHDL 3.9 10/04/2017   VLDL 25 10/04/2017   LDLCALC 139 (H) 10/04/2017    Physical Findings: AIMS: Facial and Oral Movements Muscles of Facial Expression: None, normal Lips and Perioral Area: None, normal Jaw: None, normal Tongue: None, normal,Extremity Movements Upper (arms, wrists, hands, fingers): None, normal Lower (legs, knees, ankles, toes): None, normal, Trunk Movements Neck, shoulders, hips: None, normal, Overall Severity Severity of abnormal movements (highest score from  questions above): None, normal Incapacitation due to abnormal movements: None, normal Patient's awareness of abnormal movements (rate only patient's report): No Awareness, Dental Status Current problems with teeth and/or dentures?: No Does patient usually wear dentures?: No  CIWA:  CIWA-Ar Total: 0 COWS:     Musculoskeletal: Strength & Muscle Tone: within normal limits Gait & Station: normal Patient leans: N/A  Psychiatric Specialty Exam: Physical Exam  Nursing note and vitals reviewed. Constitutional: He is oriented to person, place, and time.  Neurological: He is alert and oriented to person, place, and time.    Review of Systems  Psychiatric/Behavioral: Positive for depression and substance abuse. Negative for hallucinations, memory loss and suicidal ideas. The patient is nervous/anxious. The patient does not have insomnia.   All other systems reviewed and are negative. denies headache, no chest pain, no shortness of breath at room air, no vomiting , no fever or chills . Reports nausea which she attributes to campral trial  Blood pressure (!) 162/95, pulse 74, temperature 98.3 F (36.8 C), temperature source Oral, resp. rate 18, height 6' (1.829 m), weight 98 kg (216 lb), SpO2 96 %.Body mass index is 29.29 kg/m.  General Appearance: fairly groomed   Eye Contact:  Good  Speech:  Normal Rate  Volume:  Normal  Mood:  Reports  ongoing depression, attributes mostly to current psychosocial stressors and concerns about disposition planning  Affect:  Vaguely constricted, tends to improve during session  Thought Process:  Linear and Descriptions of Associations: Intact  Orientation:  Full (Time, Place, and Person)  Thought Content:  denies hallucinations, no delusions, not internally preoccupied   Suicidal Thoughts:  No denies suicidal plan or intention, currently denies suicidal ideations, contracts for safety on unit  Homicidal Thoughts:  No  Memory:  recent and remote grossly intact   Judgement:  Other:  improving   Insight:  Fair and improving   Psychomotor Activity:  Normal  Concentration:  Concentration: Good and Attention Span: Good  Recall:  Good  Fund of Knowledge:  Good  Language:  Good  Akathisia:  Negative  Handed:  Right  AIMS (if indicated):     Assets:  Desire for Improvement Resilience  ADL's:  Intact  Cognition:  WNL  Sleep:  Number of Hours: 6.75   Assessment -patient reports ongoing depression and anxiety.  He does acknowledge some improvement.  Denies suicidal ideations today and presents future oriented.  He had been hopeful of being admitted to [facility] rehab program and reports worsening depression and feeling of hopelessness after finding out he was not accepted.  Today however is again feeling more hopeful and working with team, CSW, on other possible options.  He is future oriented and motivated and going to a rehab, as he fears he would relapse if returning to the community at this time.  Did not tolerate Campral well due to nausea. Interested in Naltrexone, side effects and opiate blocking properties reviewed, patient denies opiate use . Of note, AST, ALT WNL.  Treatment Plan Summary:  Treatment plan reviewed as below today June 13 Daily contact with patient to assess and evaluate symptoms and progress in treatment, Medication management, Plan inpatient treatment  and medications as below   Encourage group and milieu participation to work on coping skills and symptom reduction Encourage efforts to work on sobriety and relapse prevention  Treatment team working on disposition planning - see above  D/C Campral Start Naltrexone 25 mgrs QDAY initially - for alcohol dependence Continue Buspar 10 mgrs  TID for anxiety Continue Lexapro 20 mgrs QDAY for depression, anxiety Continue Cozaar / HCTZ for HTN Continue Trazodone 150 mgrs QHS PRN for insomnia  Jenne Campus, MD 10/11/2017, 12:35 PM   Patient ID: Wynonia Lawman, male   DOB: Apr 21, 1956, 62 y.o.   MRN: [id number]

## 2017-10-11 NOTE — Progress Notes (Signed)
Pt attend wrap up group. His day was a 3. His goal was to improve from each day.

## 2017-10-11 NOTE — [facility] Group Notes (Signed)
[facility] Group Notes:  (Nursing)  Date:  10/11/2017  Time: Type of Therapy:  Nurse Education  Participation Level:  Active  Participation Quality:  Appropriate  Affect:  Appropriate  Cognitive:  Appropriate  Insight:  Appropriate  Engagement in Group:  Engaged  Modes of Intervention:  Discussion, Education and Exploration  Summary of Progress/Problems:  David Small 10/11/2017, 7:18 PM

## 2017-10-11 NOTE — Progress Notes (Signed)
Patient ID: David LiasRobert Small, male   DOB: [birth year], 62 y.o.   MRN: [id number]  D: Patient is calm and cooperative, affect is blunted and mood is depressed, but pt reports "feeling better".  Pt rates his depression,anxiety, and hopelessness levels as "7" in all areas respectively.  Pt denies SI/HI/AVH, and verbally contracts for safety on the unit.    A: Pt was educated on all of his medications, and took all as ordered.  Pt's B?P this afternoon was 162/96, HR-74, pt was medicated with PRN dose of Clonidine 0.1mg .  Pt is asymptomatic and denies any lightheadedness/dizziness.  Pt is being maintained on Q15 minute checks for safety.  R: Pt denies any current concerns and continues to be maintained on Q15 minute checks for safety.  Will continue to monitor.

## 2017-10-12 MED ORDER — NALTREXONE HCL 50 MG PO TABS: 25.0000 mg | ORAL_TABLET | Freq: Every day | ORAL | 0 refills | Status: DC

## 2017-10-12 MED ORDER — NALTREXONE HCL 50 MG PO TABS: 25.0000 mg | ORAL_TABLET | Freq: Every day | ORAL | 1 refills | Status: AC

## 2017-10-12 NOTE — Plan of Care (Signed)
Patient self inventory- Patient slept fair last night, sleep medication was requested and it did help. Appetite has been fair, energy level normal, concentration goof. Patient rates depression 6 out of 10, hopelessness 5 out of 10, and anxiety 7 out of 10, with 10 being the highest. Denies withdrawal symptoms, denies physical problems, denies SI/HI/AVH. [facility]'s [place] [facility]  Patient states he is nervous but excited about going to the treatment center. Compliant with medication administration. Patient reports he is tolerating his medications well. Safety maintained with 15 minute checks. Will continue to monitor.

## 2017-10-12 NOTE — Progress Notes (Signed)
Discharge note: Patient reviewed discharge paperwork with RN. Prescriptions were given and reviewed to patient. Denies SI/HI/AVH. All belongings from room and search room were returned to patient. Patient was walked to the parking lot to be picked [facility]. RN helped patient get into [place]. Patient thanked Therapist, artN and staff for everything.

## 2017-10-12 NOTE — [facility] Suicide Risk Assessment (Addendum)
[facility]   Principal Problem: Severe recurrent major depression without psychotic features [facility](HCC) Discharge Diagnoses:  Patient Active Problem List   Diagnosis Date Noted  . 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 Essential hypertension [I10]   . Severe recurrent major depression without psychotic features (H[facility]) [F33.2] 10/03/2017    Total Time spent with patient: 30 minutes  Musculoskeletal: Strength & Muscle Tone: within normal limits Gait & Station: normal Patient leans: N/A  Psychiatric Specialty Exam: ROS patient denies headache, no chest pain, no shortness of breath, no vomiting  Blood pressure (!) 178/86, pulse 60, temperature 97.6 F (36.4 C), temperature source Oral, resp. rate (!) 24, height 6' (1.829 m), weight 98 kg (216 lb), SpO2 95 %.Body mass index is 29.29 kg/m.  General Appearance: Improved grooming  Eye Contact::  Good  Speech:  Normal Rate409  Volume:  Normal  Mood:  Reports his mood is improved and states "I feel okay today"  Affect:  Vaguely anxious, fuller in range, smiles at times appropriately  Thought Process:  Linear and Descriptions of Associations: Intact  Orientation:  Full (Time, Place, and Person)  Thought Content:  No hallucinations, no delusions expressed, not internally preoccupied  Suicidal Thoughts:  No denies any suicidal or self-injurious ideations, no homicidal or violent ideations  Homicidal Thoughts:  No  Memory:  Recent and remote grossly intact  Judgement:  Other:  Improving  Insight:  Improving  Psychomotor Activity:  Normal  Concentration:  Good  Recall:  Good  Fund of Knowledge:Good  Language: Good  Akathisia:  No  Handed:  Right  AIMS (if indicated):     Assets:  Communication Skills Desire for Improvement Resilience  Sleep:  Number of Hours: 6  Cognition: WNL  ADL's:  Intact   Mental Status Per Nursing Assessment::   On Admission:  Suicidal ideation  indicated by patient, Suicide plan, Self-harm thoughts, Self-harm behaviors  Demographic Factors:  62 year old male, lives with a roommate  Loss Factors: Relapse ( alcohol), limited support network  Historical Factors: History of depression, history of alcohol use disorder, history of prior psychiatric admission for depression and suicidal ideations  Risk Reduction Factors:   Positive coping skills or problem solving skills  Continued Clinical Symptoms:  At this time patient presents alert, attentive, well related, well-groomed, describes mood as improved, affect is vaguely anxious but more reactive, no thought disorder, no suicidal or homicidal ideations, no psychotic symptoms, future oriented.  He is motivated and going to a rehab setting at discharge and [facility]  At this time denies medication side effects.  We have again reviewed medication side effects, to include opiate blocking properties of naltrexone. Behavior on unit in good control.  Polite/calm on approach. Patient has history of hypertension and was recently diagnosed with pneumonia.  At this time denies any chest pain or pleuritic pain, has had no cough, no shortness of breath at room air, no fever, no chills.  Encouraged to follow-up with PCP for ongoing outpatient medical care and monitoring .  Cognitive Features That Contribute To Risk:  No gross cognitive deficits noted upon discharge. Is alert , attentive, and oriented x 3   Suicide Risk:  Mild:  Suicidal ideation of limited frequency, intensity, duration, and specificity.  There are no identifiable plans, no associated intent, mild dysphoria and related symptoms, good self-control (both objective and subjective assessment), few other risk factors, and identifiable protective factors, including available and  accessible social support.  Follow-up Information    [facility] Follow up on 10/12/2017.   Why:  You  have been accepted for residential treatment.  The driver will pick you up on Friday, 10/12/17. Contact information: [address] [place] [address] [phone]           Plan Of Care/Follow-up recommendations:  Activity:  As tolerated Diet:  Heart healthy Tests:  NA Other:  See below  Patient is expressing readiness for discharge with plan of going to rehab setting [facility])  Craige Cotta, MD 10/12/2017, 11:58 AM

## 2017-10-12 NOTE — Progress Notes (Signed)
Recreation Therapy Notes  Date: 6.14.19 Time: 0930 Location: [address] Dayroom  Group Topic: Stress Management  Goal Area(s) Addresses:  Patient will verbalize importance of using healthy stress management.  Patient will identify positive emotions associated with healthy stress management.   Intervention: Stress Management  Activity :  Progressive Muscle Relaxation.  LRT introduced the stress management technique of progressive muscle relaxation.  LRT read Small script to guide patients in doing the activity.  Patients were to follow along as script was read to engage in activity.  Education:  Stress Management, Discharge Planning.   Education Outcome: Acknowledges edcuation/In group clarification offered/Needs additional education  Clinical Observations/Feedback: Pt did not attend group.      David Small, LRT/CTRS         David Small 10/12/2017 11:25 AM 

## 2017-10-12 NOTE — Progress Notes (Signed)
  [facility] :  Will you be returning to the same living situation after discharge:  No. [facility].  At discharge, do you have transportation home?: Yes,  [facility] representative [facility] will pick the patient up for transport at discharge Do you have the ability to pay for your medications: Yes,  BC[employer]  Release of information consent forms completed and in the chart;  Patient's signature needed at discharge.  Patient to Follow up at: Follow-up Information    [facility] Follow up on 10/12/2017.   Why:  You have been accepted for residential treatment.  The driver will pick you up on Friday, 10/12/17. Contact information: [address] [place] [id number] [phone]           Next level of care provider [facility] Link:yes  Safety Planning and Suicide Prevention discussed: Yes,  with the patient  Have you used any form of tobacco in the last 30 days? (Cigarettes, Smokeless Tobacco, Cigars, and/or Pipes): Yes  Has patient been referred to the Quitline?: Patient refused referral  Patient has been referred for addiction treatment: Yes  Maeola SarahJolan E Jaymason Ledesma, LCSWA 10/12/2017, 10:08 AM

## 2017-10-12 NOTE — Discharge Summary (Addendum)
Physician Discharge Summary Note  Patient:  David Small is an 62 y.o., male MRN:  [id number] DOB:  23-May-1955 Patient phone:  [phone] (home)  Patient address:   [place] [address],  Total Time spent with patient: 45 minutes  Date of Admission:  10/03/2017 Date of Discharge: 10/12/2017  Reason for Admission:  Suicide threat  Principal Problem: Severe recurrent major depression without psychotic features [facility]) Discharge Diagnoses: Patient Active Problem List   Diagnosis Date Noted  . Alcohol dependence with alcohol-induced mood disorder ([place]) [F10.24]     Priority: High  . Severe recurrent major depression without psychotic features ([place]) [F33.2] 10/03/2017    Priority: High  . Essential hypertension [I10]     Past Psychiatric History: depression  Past Medical History:  Past Medical History:  Diagnosis Date  . Depression   . Hypertension    History reviewed. No pertinent surgical history. Family History: History reviewed. No pertinent family history. Family Psychiatric  History: see above  Social History:  Social History   Substance and Sexual Activity  Alcohol Use Yes     Social History   Substance and Sexual Activity  Drug Use Not Currently    Social History   Socioeconomic History  . Marital status: Single    Spouse name: Not on file  . Number of children: Not on file  . Years of education: Not on file  . Highest education level: Not on file  Occupational History  . Not on file  Social Needs  . Financial resource strain: Not on file  . Food insecurity:    Worry: Not on file    Inability: Not on file  . Transportation needs:    Medical: Not on file    Non-medical: Not on file  Tobacco Use  . Smoking status: Current Every Day Smoker    Types: Cigarettes  . Smokeless tobacco: Never Used  Substance and Sexual Activity  . Alcohol use: Yes  . Drug use: Not Currently  . Sexual activity: Not Currently  Lifestyle  . Physical  activity:    Days per week: Not on file    Minutes per session: Not on file  . Stress: Not on file  Relationships  . Social connections:    Talks on phone: Not on file    Gets together: Not on file    Attends religious service: Not on file    Active member of club or organization: Not on file    Attends meetings of clubs or organizations: Not on file    Relationship status: Not on file  Other Topics Concern  . Not on file  Social History Narrative  . Not on file    Hospital Course:  On admission 10/04/2017:  61 y.o.malewho presents voluntarilyaccompanied reporting primary symptoms of depression. He states that he attempted to hand himself This morning. Pt endorsessocial withdrawal, loss of interest in usual pleasures, decreased concentration, fatigue, irritability, decreased sleep, decreased appetite and feelings of hopelessness. Pt endorses SI,deniesHI, psychosis. Pt States that he relapsed drinking last September after being sober for 2 1/2 years.PT describes a couple ofpast attempts,nohistory of violence. Pt states that onset of symptoms begangetting worse in the past month. Pt identifies primary stressors assome things that have been going on in his life that he does not want to elaborate on, "It is too long of a story". He feels alone and has no support, feels hopeless. Pt identifies primary residence asin an apartment with some roommates, but has been  feeling so low lately that he went to a hotel to be alone. Ptdenieslegal involvement. Ptdeniesabuse history. Pt identifiesprevious treatment as IPtreatment 4 years ago followed up by Op which he quit after about 1 year when he felt like it wasn't helping. Pt cannot remember the medication he was on. Pt hasfairinsight and judgment. Pt's memory is typical.?  10/04/17 [facility] MD Assessment: Patient is seen and examined. Patient is a 62 year old male with a past psychiatric history significant for major depression as well as  alcohol use disorder. He presented to the [facility] emergency department yesterday with suicidal ideation. The patient presented with symptoms of depression including helplessness, hopelessness, worthlessness, fatigue, suicidal ideation. The patient admitted that he had attempted to hang himself 1 to 2 days prior to admission. He had a CT scan of the neck which was essentially negative. He also complained of a cough, and was found to have a pneumonia by chest x-ray. He stated that he his depression had worsened over the last 7 months. He had been sober for approximately 2-1/2 years, but then started drinking approximately 7 months ago. He stated since then his depression worsened. He stated that he had no friends, no family contacts, and had recently lost his part-time job. This put his housing in some significant problem. He admitted to a previous suicide attempt in 2015. He was hospitalized at that time. He is unsure what medication he took at that time, but believes it may have been Zoloft. He stated that after his hospitalization he was unable to afford that medication. He was admitted to the hospital for evaluation and stabilization.  On evaluation today: Patient confirms the other information and feels that he has told everything he needs to say at this time. He agree to attend groups and to participate in activities . He states taht he will be safe on the unit, with no current SI/HI/AVH.   Medications:  Started Ativan alcohol detox protocol, Zoloft 25 mg daily for depression, and Trazodone 50 mg at bedtime PRN sleep, repeat times one if needed.  Vistaril 25 mg every six hours PRN anxiety  10/05/2017:   Patient is seen and examined.  Patient is a 62 year old male with a past psychiatric history significant for major depression as well as alcohol use disorder.  He is seen in follow-up.  He stated he was having a bad morning today.  He remembered that Zoloft was not effective in the past  and wanted to discuss changing his medicines.  He has attended groups, and that seems to be going a little bit better.  We discussed options for medications.  He is agreed to a trial of Lexapro.  We also discussed the possibility of adding BuSpar as for anxiety.  He characterizes depression is more and anxious form than anything else.  He stated his suicidal ideation had decreased.  Medications:  Started Buspar 5 mg BID for anxiety  10/06/2017:   Patient is seen and examined.  Patient is a 62 year old male with a past psychiatric history significant for major depression as well as alcohol use disorder.  He also has pneumonia.  He is seen in follow-up.  He is doing better today.  He has still not really started making a discharge treatment plan.  We discussed that today.  His cough seems to be improving.  He denied any suicidal ideation.  He said his mood was improving.  He denied any side effects to his current medications.  Medications:  Increase Trazodone to 100  mg at bedtime for sleep and Celexa 10 mg to 20 mg daily for depression  10/07/2017:  Patient is seen and examined.  Patient is a 62 year old male with a past psychiatric history significant for major depression as well as alcohol use disorder.  He also has hypertension as well as a pneumonia.  He stated his mood continues to slowly improve.  He is anxious about housing after he leaves.  He stated he has contacted 1 of his friends, but they have not called him back.  His alcohol withdrawal symptoms are stable.  He still anxious.  His blood pressure remains elevated, but he did state that the albuterol nebulization treatments did cough and he was able to get some of the mucus elevated out of his system.  We discussed the possibility of a chest x-ray to follow-up his last with regard to his pneumonia.  He continues on azithromycin as well as Augmentin.  No other complaints today.  Medications:  Started Campral 333 mg TID for alcohol withdrawal    10/08/2017:   Patient is seen and examined.  Patient is a 62 year old male with a past psychiatric history significant for major depression as well as alcohol use disorder.  He also suffers from hypertension as well as pneumonia.  He is seen in follow-up.  He continues to ruminate about his suicidal thoughts.  He stated he has been suicidal for "forever".  He continues to also be depressed.  He is very anxious about the fact that he has essentially no living quarters right now.  He stated he has a friend's place to go to, but he seems to be perturbed about all of that.  We repeated a chest x-ray yesterday, and it seems that it is clearing up.  He continues on Augmentin and azithromycin.  His blood pressure is a bit better today at 147/97.  He continues on clonidine 0.1 mg p.o. 3 times daily as needed systolic blood pressure greater than 160, hydrochlorothiazide 25 mg p.o. daily and lisinopril 20 mg p.o. daily.  I had written for repeat chemistries, and those cells still not been done.  I am going to order those for tomorrow morning (again).  Medications:  Increased Buspar 5 mg to 10 mg TID for anxiety, Increased Trazodone 100 mg at bedtime to 150 mg for sleep  10/09/2017:  Patient reports he is feeling " a little bit better today". Reports he is becoming more optimistic, and in particular states that he is excited about [facility] referral . Today denies suicidal ideations. Denies medication side effects.  I have discussed case with treatment team and have met with patient . 62 year old male, lives alone , history of depression and alcohol use disorder. Presented due to worsening depression and suicidal ideations.  Reports he remains depressed, but as above, endorses some improvement compared to admission and is more future oriented, expressing interest in going to [facility] at discharge. States " I need something like it, because I have little support, and I know I will be back at square one if I go back out".  No  current alcohol WDL symptoms- no tremors, no diaphoresis, no restlessness, BP has tended to improve, no tachycardia.  Denies medication side effects.  Medications:   No changes made  10/10/2017:  Face to face evaluation completed, case discussed with treatment team and chart reviewed. Davontay is a 62 year old male, lives alone , history of depression and alcohol use disorder. Presented due to worsening depression and suicidal  ideations.  Medications:  No changes made  During this evaluation, patient is alert ad oriented x4, calm and cooperative. He endorses that mood continues to slowly improved and reports less feelings of hopelessness as he is hopeful that he will go to [facility] for his alcohol abuse following discharge. He continues  to present with a depressed mood although when discussing discharge plan, his mood and affect does brighten. He denies any passive or active SI during this evaluation. He does continue to talk about lack of support once discharge and reports he feel as though if he does not go to another replacements to help him better mange his alcohol abuse, he make backslide. He denies any withdrawal symptoms and none are observed. Denies HI or AVH and does not appear internally preoccupied.  He is complaint with medication regime and  denies medication side effects. He continues to actively  participate in unit activities. Reports both appetite and resting pattern continues to improve. At this time, he is contracting for safety on the unit.  Medications:  No changes made  10/11/2017:   Patient reports he felt more depressed and hopeless after he found out that he was not accepted to [facility].  Today, however, he is feeling " a little better", which he attributes in part to working with [place] on identifying other disposition options.  States that he is reluctant to return to friend's house where he had been staying because "I know I will go back to square one, slide back. States " I really need a  rehab".  Thus far tolerating medications well but states Campral was not well tolerated due to nausea, does not want to continue this medication.  I have discussed case with treatment team and have met with patient.  He is a 62 year old male, lives alone, reports history of alcohol dependence and depression.  Presented due to worsening depression, suicidal ideations . Patient had been excited about the possibility of going to [facility] ( rehab) , and felt dejected and hopeless after he found out he was not accepted there.  Today denies suicidal ideations, and presents future oriented, focusing mostly on disposition plans.  As above, he is motivated and going to a rehab setting at discharge, expresses concern of high risk of relapse and worsening depression if her returns to community at this time.  Tolerating medications well except for Campral which caused nausea.  We discussed other treatment options such as naltrexone.  Behavior on unit in good control, no disruptive or agitated behaviors.  Currently denies suicidal ideations.  Of note, patient was recently diagnosed with pneumonia.  At this time denies fever or chills, no pleuritic pain, no coughing, no shortness of breath at room air.  Medications:  Discontinue Campral and started Naltrexone 25 mg daily for alcohol cravings  10/12/2017:  Patient has met maximum benefit from hospitalization. Denies suicidal/homicidal ideations, hallucinations, or withdrawal symptoms. Discharge instructions were provided with explanations along with 24 hour crisis numbers, follow-up appointment, and Rx. Stable for discharge.  Physical Findings: AIMS: Facial and Oral Movements Muscles of Facial Expression: None, normal Lips and Perioral Area: None, normal Jaw: None, normal Tongue: None, normal,Extremity Movements Upper (arms, wrists, hands, fingers): None, normal Lower (legs, knees, ankles, toes): None, normal, Trunk Movements Neck, shoulders, hips: None, normal,  Overall Severity Severity of abnormal movements (highest score from questions above): None, normal Incapacitation due to abnormal movements: None, normal Patient's awareness of abnormal movements (rate only patient's report): No Awareness, Dental Status Current problems with  teeth and/or dentures?: No Does patient usually wear dentures?: No  CIWA:  CIWA-Ar Total: 0 COWS:     Musculoskeletal: Strength & Muscle Tone: within normal limits Gait & Station: normal Patient leans: N/A  Psychiatric Specialty Exam: Physical Exam  Nursing note and vitals reviewed. Constitutional: He is oriented to person, place, and time. He appears well-developed and well-nourished.  HENT:  Head: Normocephalic.  Neck: Normal range of motion.  Respiratory: Effort normal.  Musculoskeletal: Normal range of motion.  Neurological: He is alert and oriented to person, place, and time.  Psychiatric: His speech is normal and behavior is normal. Judgment and thought content normal. His mood appears anxious. Cognition and memory are normal.    Review of Systems  Psychiatric/Behavioral: Positive for substance abuse. The patient is nervous/anxious.   All other systems reviewed and are negative.   Blood pressure (!) 178/86, pulse 60, temperature 97.6 F (36.4 C), temperature source Oral, resp. rate (!) 24, height 6' (1.829 m), weight 98 kg (216 lb), SpO2 95 %.Body mass index is 29.29 kg/m.  General Appearance: Casual  Eye Contact:  Good  Speech:  Normal Rate  Volume:  Normal  Mood:  Anxious, mild  Affect:  Congruent  Thought Process:  Coherent and Descriptions of Associations: Intact  Orientation:  Full (Time, Place, and Person)  Thought Content:  WDL and Logical  Suicidal Thoughts:  No  Homicidal Thoughts:  No  Memory:  Immediate;   Good Recent;   Good Remote;   Good  Judgement:  Good  Insight:  Good  Psychomotor Activity:  Normal  Concentration:  Concentration: Good and Attention Span: Good  Recall:   Good  Fund of Knowledge:  Good  Language:  Good  Akathisia:  No  Handed:  Right  AIMS (if indicated):     Assets:  Leisure Time Physical Health Resilience Social Support  ADL's:  Intact  Cognition:  WNL  Sleep:  Number of Hours: 6     Have you used any form of tobacco in the last 30 days? (Cigarettes, Smokeless Tobacco, Cigars, and/or Pipes): Yes  Has this patient used any form of tobacco in the last 30 days? (Cigarettes, Smokeless Tobacco, Cigars, and/or Pipes)  Yes, A prescription for an FDA-approved tobacco cessation medication was offered at discharge and the patient refused  Blood Alcohol level:  Lab Results  Component Value Date   ETH <10 87/86/7672    Metabolic Disorder Labs:  Lab Results  Component Value Date   HGBA1C 5.7 (H) 10/04/2017   MPG 116.89 10/04/2017   No results found for: PROLACTIN Lab Results  Component Value Date   CHOL 221 (H) 10/04/2017   TRIG 125 10/04/2017   HDL 57 10/04/2017   CHOLHDL 3.9 10/04/2017   VLDL 25 10/04/2017   LDLCALC 139 (H) 10/04/2017    See Psychiatric Specialty Exam and Suicide Risk Assessment completed by Attending Physician prior to discharge.  Discharge destination:  Home  Is patient on multiple antipsychotic therapies at discharge:  No   Has Patient had three or more failed trials of antipsychotic monotherapy by history:  No  Recommended Plan for Multiple Antipsychotic Therapies: NA  Discharge Instructions    Diet - low sodium heart healthy   Complete by:  As directed    Diet - low sodium heart healthy   Complete by:  As directed    Discharge instructions   Complete by:  As directed    Follow-up with outpatient provider   Increase activity slowly  Complete by:  As directed      Allergies as of 10/12/2017   No Known Allergies     Medication List    TAKE these medications     Indication  amoxicillin-clavulanate 1000-62.5 MG 12 hr tablet Commonly known as:  AUGMENTIN XR Take 2 tablets by mouth every  12 (twelve) hours.  Indication:  Community Acquired Pneumonia   busPIRone 10 MG tablet Commonly known as:  BUSPAR Take 1 tablet (10 mg total) by mouth 3 (three) times daily.  Indication:  Anxiety Disorder   escitalopram 20 MG tablet Commonly known as:  LEXAPRO Take 1 tablet (20 mg total) by mouth daily.  Indication:  Major Depressive Disorder   hydrochlorothiazide 25 MG tablet Commonly known as:  HYDRODIURIL Take 1 tablet (25 mg total) by mouth daily.  Indication:  High Blood Pressure Disorder   lisinopril 20 MG tablet Commonly known as:  PRINIVIL,ZESTRIL Take 1 tablet (20 mg total) by mouth daily.  Indication:  High Blood Pressure Disorder   losartan 100 MG tablet Commonly known as:  COZAAR Take 1 tablet (100 mg total) by mouth daily.  Indication:  High Blood Pressure Disorder   naltrexone 50 MG tablet Commonly known as:  DEPADE Take 0.5 tablets (25 mg total) by mouth daily. Start taking on:  10/13/2017  Indication:  Excessive Use of Alcohol   traZODone 150 MG tablet Commonly known as:  DESYREL Take 1 tablet (150 mg total) by mouth at bedtime and may repeat dose one time if needed.  Indication:  [facility] Follow up on 10/12/2017.   Why:  You have been accepted for residential treatment.  The driver will pick you up on Friday, 10/12/17. Contact information: [place] [address] [phone]           Follow-up recommendations:  Activity:  as tolerated Diet:  heart healthy diet  Comments:  Continue care at [facility]  Signed: Waylan Boga, NP 10/12/2017, 9:37 AM   Patient seen, Suicide Assessment Completed.  Disposition Plan Reviewed

## 2017-10-12 NOTE — Progress Notes (Signed)
Pt reports he is doing better and is more hopeful this evening.  He reports that the CSW was able to get him into the treatment program at [facility].  He is supposed to discharge tomorrow after lunch and the facility is coming to transport him to the treatment center.  Pt denies SI/HI/AVH.  He voices no needs or concerns, but did request a sleep aid which he was given at bedtime. Support and encouragement offered.  Safety maintained with q15 minute checks.

## 2019-04-08 IMAGING — CT CT NECK W/ CM
4 of 5 series · 15 of 33 positions shown, 17 images · IV contrast (Omni 300)
Comparison: None.

CLINICAL DATA: Attempted hanging.  Diffuse neck pain.

EXAM:
CT NECK WITH CONTRAST
TECHNIQUE: Multidetector CT imaging of the neck was performed using the
standard protocol following the bolus administration of intravenous
contrast.
CONTRAST:  75mL OMNIPAQUE IOHEXOL 300 MG/ML  SOLN

[Series 3: neck 2.0 st · axial · 0.40mm/px · z∈[-261,-153]mm · 3 of 136 slices shown (1 of 3)]
[im 28/136  bone]
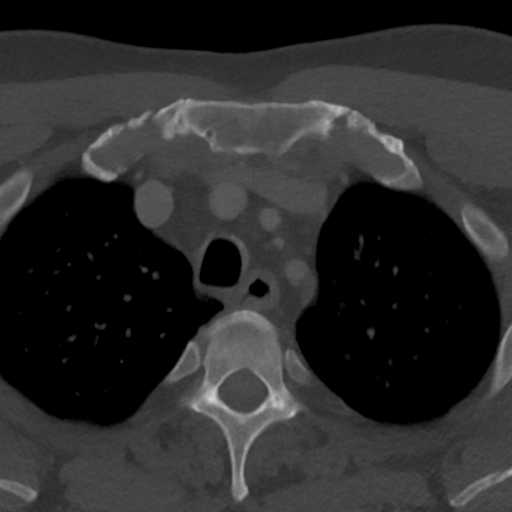
[im 55/136  bone]
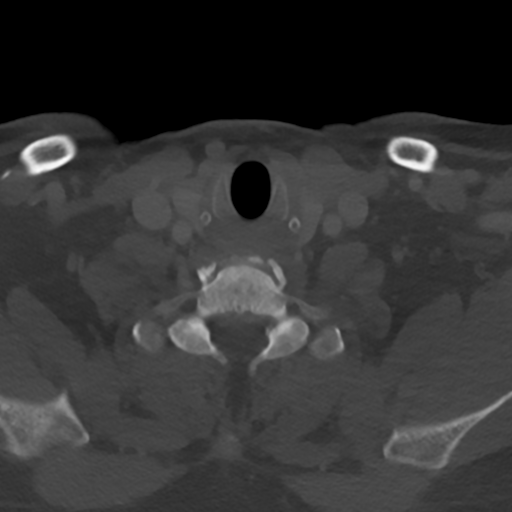
[im 82/136  bone]
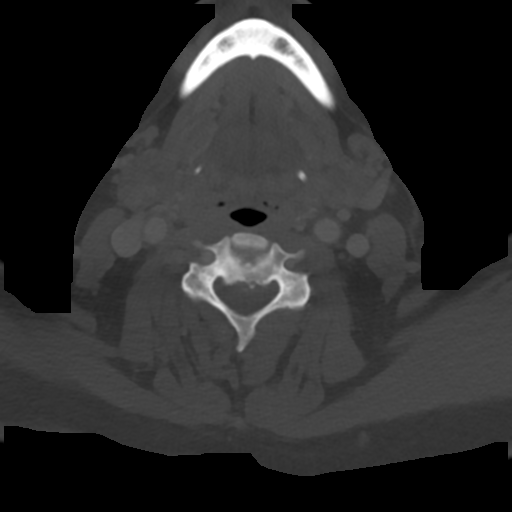

[Series 6: neck 2.0 st · sagittal · 0.47mm/px · 5 of 96 slices shown, 6 images (2 of 3)]
[im 32/96  bone]
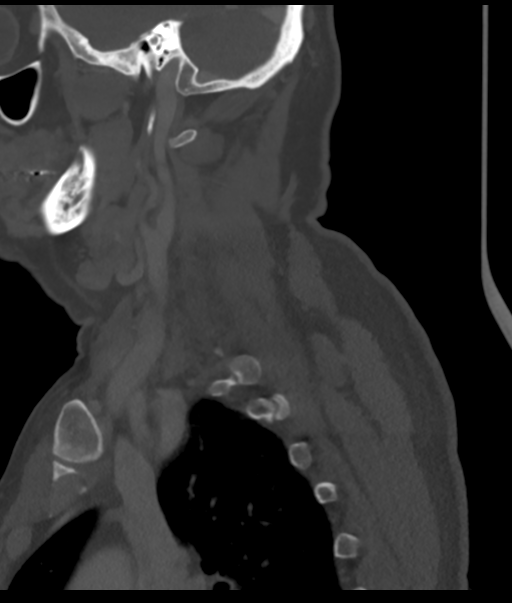
[im 40/96  bone]
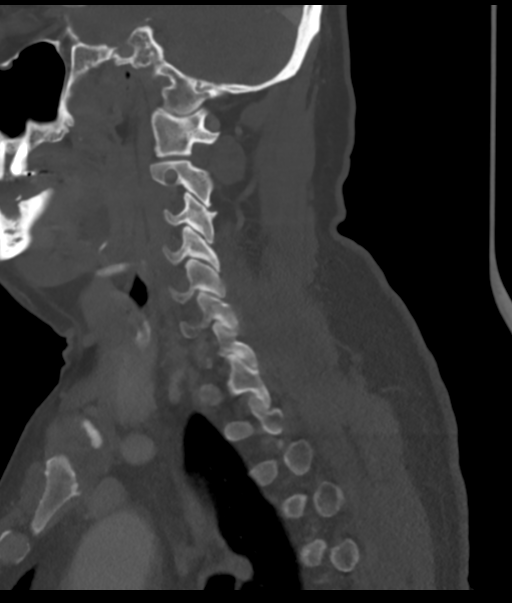
[im 48/96  soft-tissue]
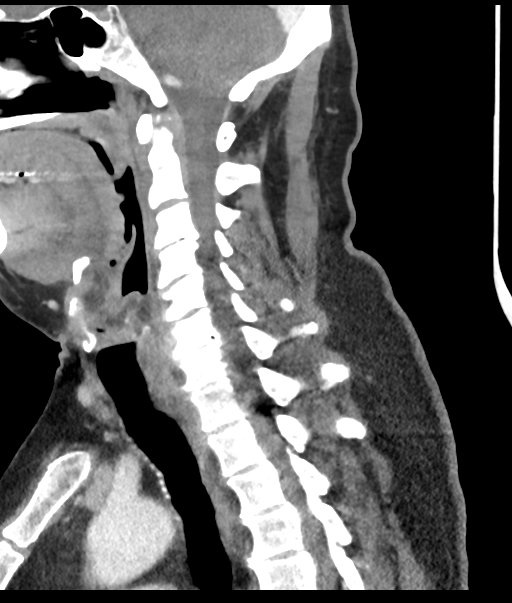
[im 48/96  bone]
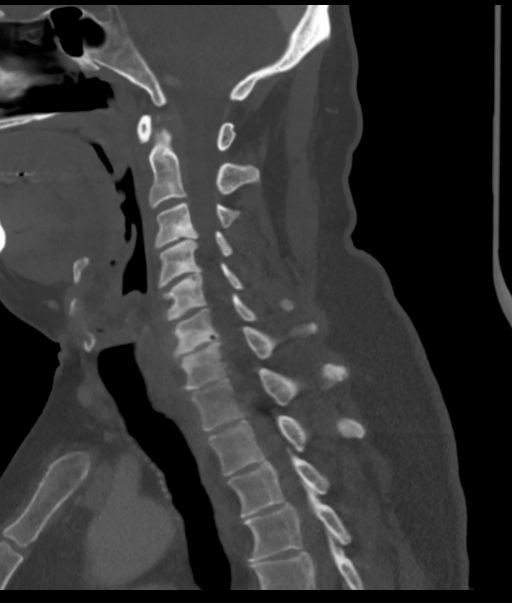
[im 56/96  bone]
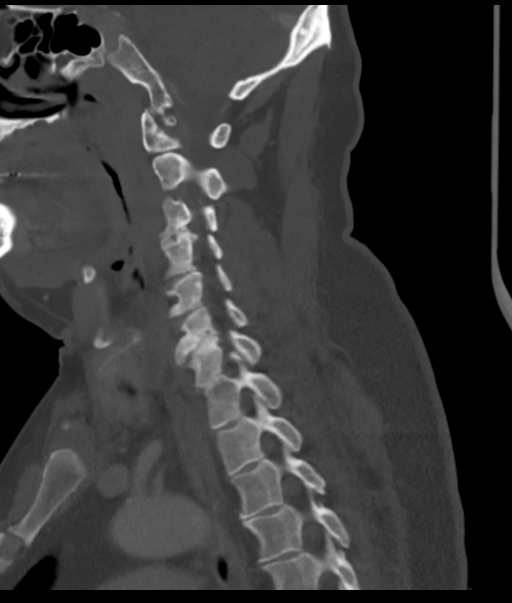
[im 64/96  bone]
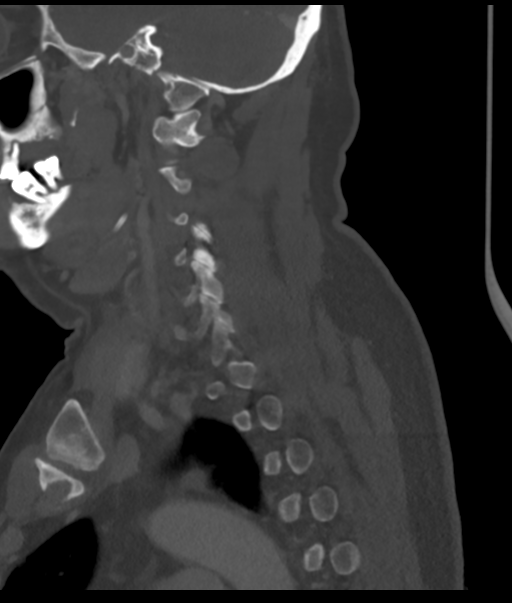

[Series 7: neck 2.0 st · coronal · 0.38mm/px · 3 of 105 slices shown (3 of 3)]
[im 21/105  bone]
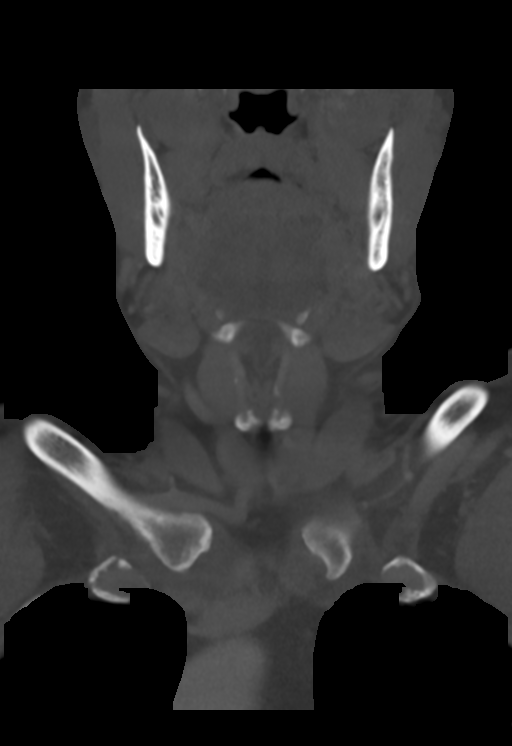
[im 42/105  bone]
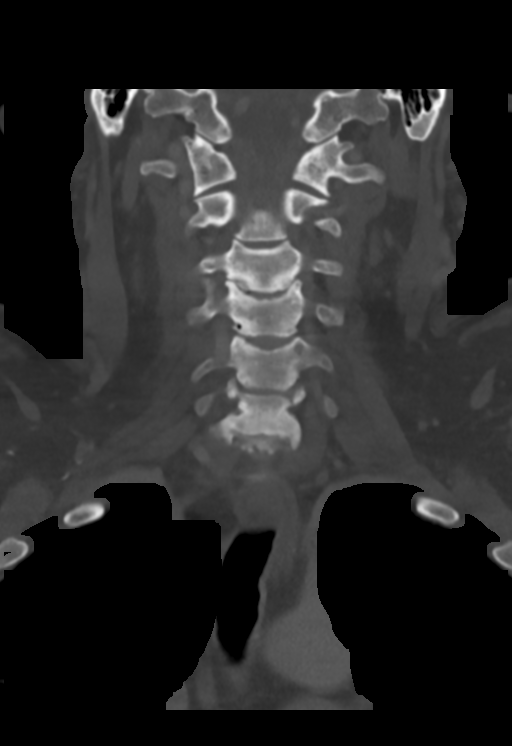
[im 63/105  bone]
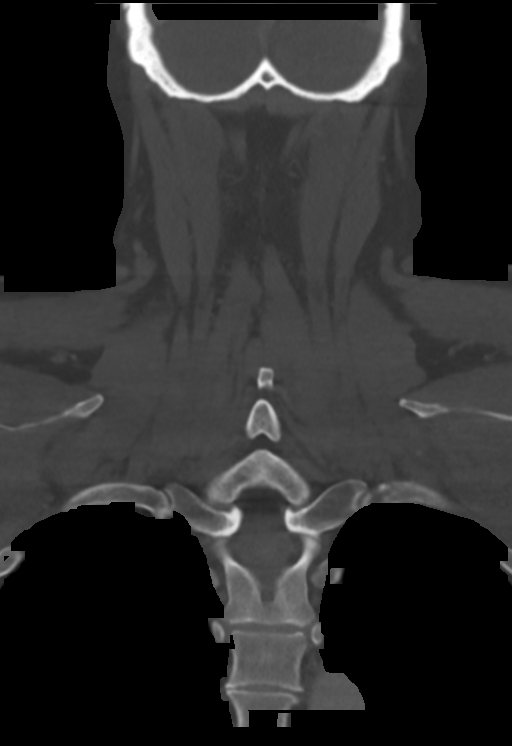

[Series 8: neck 2.0 st orthogonal · axial · 0.39mm/px · z∈[-306,-123]mm · 4 of 162 slices shown, 5 images]
[im 33/162  soft-tissue]
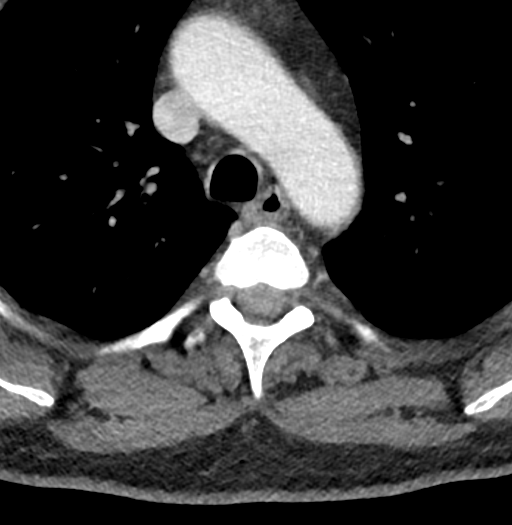
[im 33/162  bone]
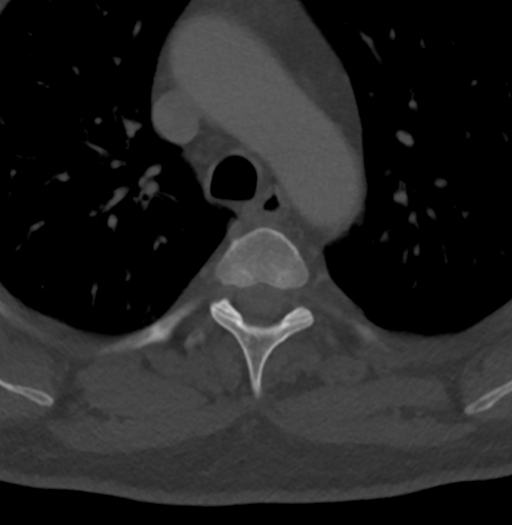
[im 65/162  bone]
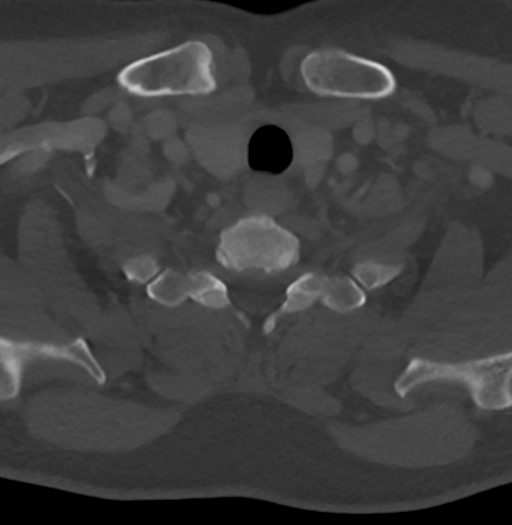
[im 97/162  bone]
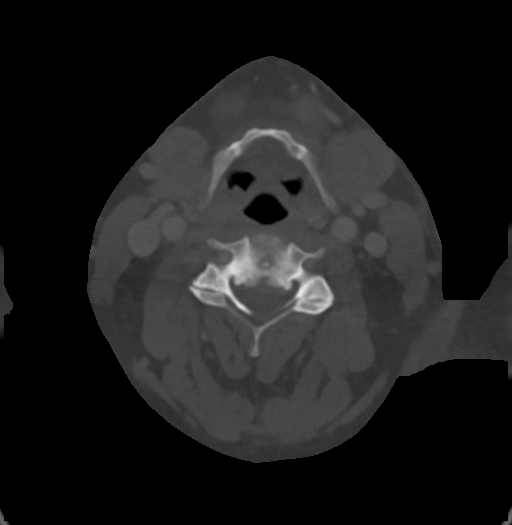
[im 129/162  bone]
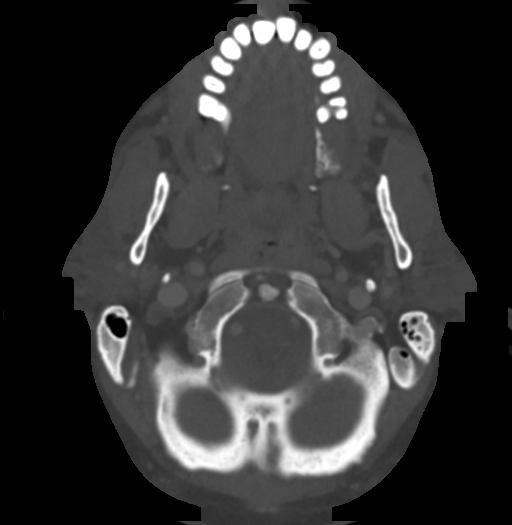

[15 of 33 positions shown; findings below may reference images not displayed]

FINDINGS: Pharynx and larynx: No mucosal or submucosal lesion. Oropharynx,
hypopharynx and larynx superior intact. The glottis appears
voluntarily closed.

Salivary glands: Submandibular and parotid glands are normal.

Thyroid: Normal

Lymph nodes: No enlarged or low-density nodes on either side of the
neck.

Vascular: No abnormal vascular finding other than minimal
atherosclerosis at the carotid bifurcations.

Limited intracranial: Normal

Visualized orbits: Normal

Mastoids and visualized paranasal sinuses: Normal

Skeleton: No fracture. Ordinary osteoarthritis at the C1-2
articulation. Degenerative spondylosis most pronounced at C3-4 and
C6-7.

Upper chest: Negative

Other: No evidence of disruption of the laryngeal cartilages.
IMPRESSION: No traumatic finding by CT. No evidence of disruption of the airway.
The glottis appears to be voluntarily closed. No evidence of
cartilage injury or bone injury. No evidence of soft tissue
hematoma.

## 2019-04-12 IMAGING — CR DG CHEST 2V
2 series · 2 of 2 positions shown · non-contrast
Comparison: 10/03/2017

CLINICAL DATA: Follow-up pneumonia.

EXAM:
CHEST - 2 VIEW

[w chest pa]
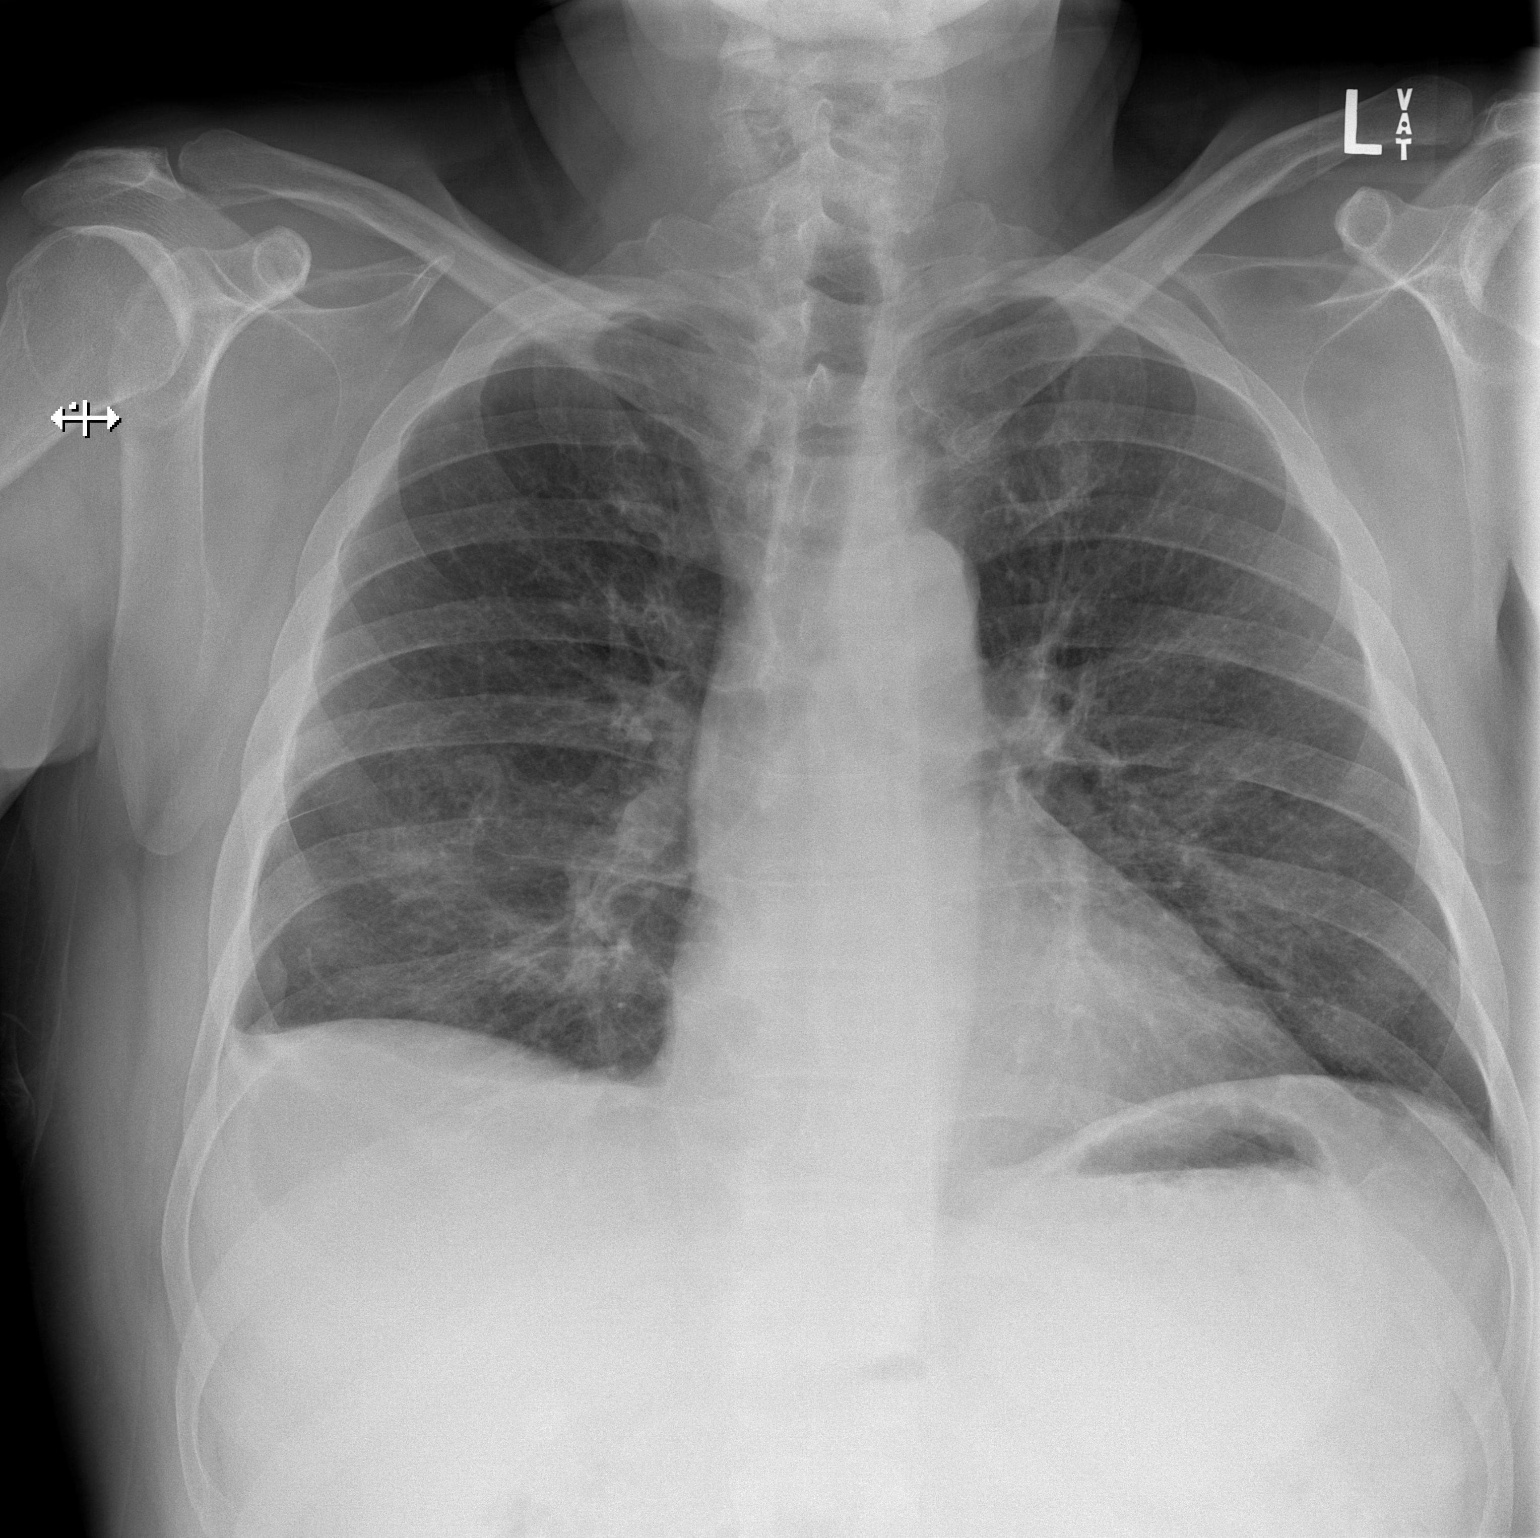

[w chest lat]
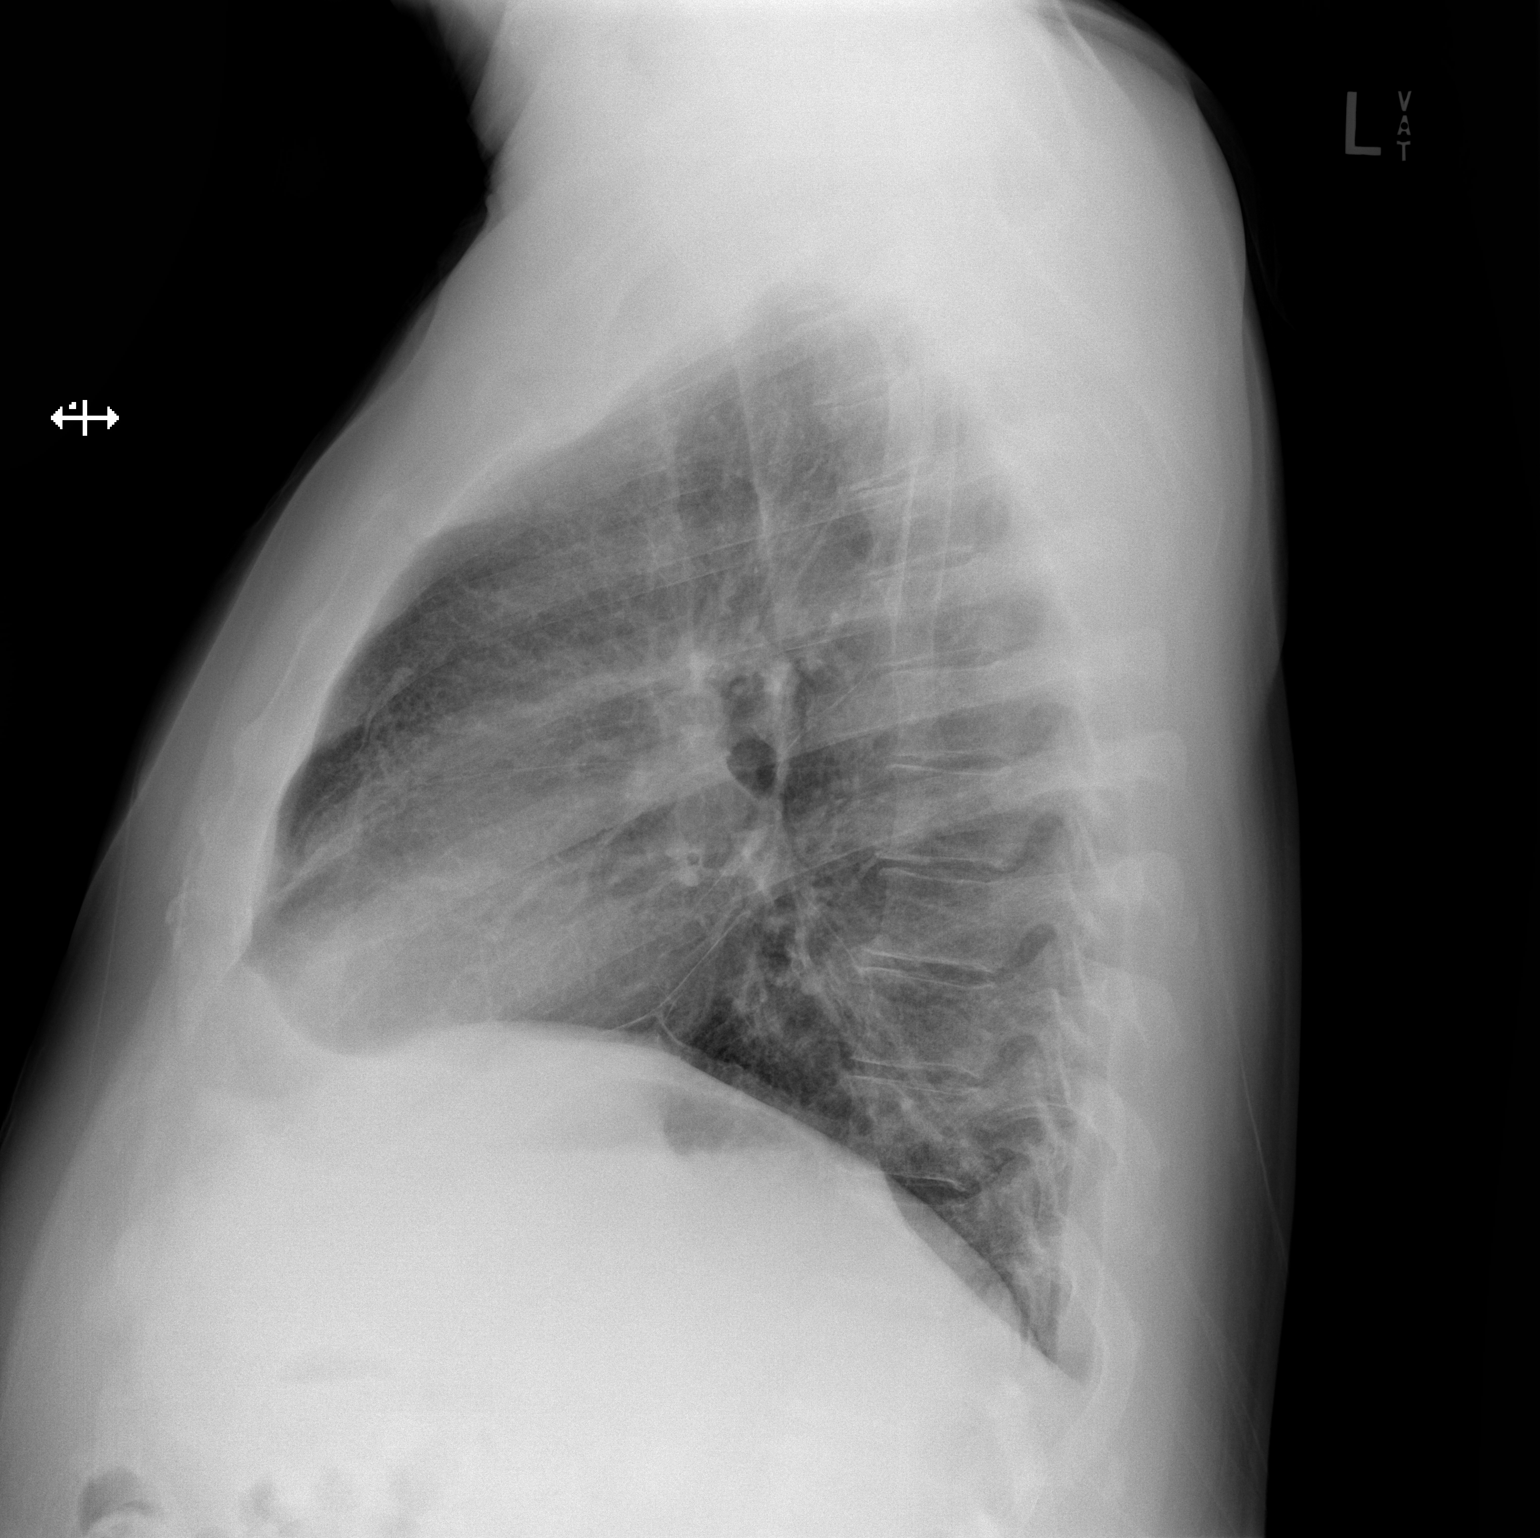

[2 of 2 positions shown; findings below may reference images not displayed]

FINDINGS: The heart size and mediastinal contours are within normal limits.
Patchy opacity in right midlung shows mild improvement since
previous study. No new or worsening areas of pulmonary infiltrate
are seen. Mild pleural thickening at right lung base is unchanged.
No evidence of pleural effusion.
IMPRESSION: Mild improvement in patchy right midlung opacity, likely due to
resolving pneumonia. Continued radiographic follow-up is
recommended.
